# Patient Record
Sex: Female | Born: 1980 | Race: Black or African American | Hispanic: No | Smoking: Never smoker
Health system: Southern US, Community
[De-identification: ages and names within clinical notes are randomized; demographics above are authoritative.]

## PROBLEM LIST (undated history)

## (undated) DIAGNOSIS — J45909 Unspecified asthma, uncomplicated: Secondary | ICD-10-CM

## (undated) DIAGNOSIS — M1990 Unspecified osteoarthritis, unspecified site: Secondary | ICD-10-CM

## (undated) DIAGNOSIS — D219 Benign neoplasm of connective and other soft tissue, unspecified: Secondary | ICD-10-CM

## (undated) DIAGNOSIS — D649 Anemia, unspecified: Secondary | ICD-10-CM

## (undated) DIAGNOSIS — K219 Gastro-esophageal reflux disease without esophagitis: Secondary | ICD-10-CM

## (undated) HISTORY — DX: Gastro-esophageal reflux disease without esophagitis: K21.9

## (undated) HISTORY — DX: Unspecified asthma, uncomplicated: J45.909

## (undated) HISTORY — PX: TUBAL LIGATION: SHX77

## (undated) HISTORY — DX: Anemia, unspecified: D64.9

## (undated) HISTORY — DX: Unspecified osteoarthritis, unspecified site: M19.90

---

## 2001-12-04 ENCOUNTER — Emergency Department (HOSPITAL_COMMUNITY): Admission: EM | Admit: 2001-12-04 | Discharge: 2001-12-04 | Payer: Self-pay | Admitting: Emergency Medicine

## 2004-06-26 ENCOUNTER — Emergency Department: Payer: Self-pay | Admitting: Emergency Medicine

## 2004-06-30 ENCOUNTER — Ambulatory Visit: Payer: Self-pay | Admitting: Emergency Medicine

## 2004-11-10 ENCOUNTER — Emergency Department: Payer: Self-pay | Admitting: Emergency Medicine

## 2004-12-19 ENCOUNTER — Emergency Department: Payer: Self-pay | Admitting: Emergency Medicine

## 2005-04-08 ENCOUNTER — Emergency Department (HOSPITAL_COMMUNITY): Admission: EM | Admit: 2005-04-08 | Discharge: 2005-04-08 | Payer: Self-pay | Admitting: Family Medicine

## 2005-06-10 ENCOUNTER — Emergency Department (HOSPITAL_COMMUNITY): Admission: EM | Admit: 2005-06-10 | Discharge: 2005-06-10 | Payer: Self-pay | Admitting: Family Medicine

## 2005-12-28 ENCOUNTER — Emergency Department (HOSPITAL_COMMUNITY): Admission: EM | Admit: 2005-12-28 | Discharge: 2005-12-28 | Payer: Self-pay | Admitting: Family Medicine

## 2006-04-09 ENCOUNTER — Emergency Department (HOSPITAL_COMMUNITY): Admission: EM | Admit: 2006-04-09 | Discharge: 2006-04-09 | Payer: Self-pay | Admitting: Emergency Medicine

## 2006-06-13 ENCOUNTER — Ambulatory Visit: Payer: Self-pay | Admitting: Unknown Physician Specialty

## 2007-02-16 ENCOUNTER — Emergency Department: Payer: Self-pay | Admitting: Unknown Physician Specialty

## 2008-04-23 ENCOUNTER — Ambulatory Visit: Payer: Self-pay

## 2010-12-13 ENCOUNTER — Emergency Department: Payer: Self-pay | Admitting: Emergency Medicine

## 2012-03-05 ENCOUNTER — Emergency Department: Payer: Self-pay | Admitting: Emergency Medicine

## 2013-05-31 ENCOUNTER — Emergency Department: Payer: Self-pay | Admitting: Emergency Medicine

## 2013-11-17 ENCOUNTER — Emergency Department: Payer: Self-pay | Admitting: Emergency Medicine

## 2014-03-03 ENCOUNTER — Encounter: Payer: Self-pay | Admitting: Gastroenterology

## 2014-03-31 ENCOUNTER — Ambulatory Visit: Payer: Self-pay | Admitting: Gastroenterology

## 2014-07-18 ENCOUNTER — Encounter: Payer: Self-pay | Admitting: Gastroenterology

## 2014-07-18 ENCOUNTER — Ambulatory Visit (INDEPENDENT_AMBULATORY_CARE_PROVIDER_SITE_OTHER): Payer: BLUE CROSS/BLUE SHIELD | Admitting: Gastroenterology

## 2014-07-18 VITALS — BP 138/62 | HR 68 | Ht 66.25 in | Wt 172.0 lb

## 2014-07-18 DIAGNOSIS — K219 Gastro-esophageal reflux disease without esophagitis: Secondary | ICD-10-CM | POA: Diagnosis not present

## 2014-07-18 MED ORDER — PANTOPRAZOLE SODIUM 40 MG PO TBEC: 40.0000 mg | DELAYED_RELEASE_TABLET | Freq: Every day | ORAL | Status: AC

## 2014-07-18 NOTE — Progress Notes (Signed)
HPI: This is a   very pleasant 34 year old woman   whom I am meeting for the first time today  Chief complaint is intermittent dysphagia, GERD symptoms  Has trouble with swallowing, pyrosis.  Her sister found she had h. Pylori.  Feels solid food dysphagia (neck, chest) for 1-2 years.   Overall stable weight.  Will occur 2-3 times per weeks.  Acid taste in mouth.  Started pantoprazole, 2 months ago.  This has really helped.  Usually twice daily.  AM pill, eats 2 hours later.  PM pill is at bedtime.  3 sons [phone])  Has cut back on caffeine  Review of systems: Pertinent positive and negative review of systems were noted in the above HPI section. Complete review of systems was performed and was otherwise normal.   Past Medical History  Diagnosis Date  . Anemia     Past Surgical History  Procedure Laterality Date  . Tubal ligation      Current Outpatient Prescriptions  Medication Sig Dispense Refill  . pantoprazole (PROTONIX) 20 MG tablet Take 20 mg by mouth daily.   1   No current facility-administered medications for this visit.    Allergies as of 07/18/2014 - Review Complete 07/18/2014  Allergen Reaction Noted  . Penicillins Rash 07/18/2014    Family History  Problem Relation Age of Onset  . Ovarian cancer Mother   . Ovarian cancer Maternal Grandmother   . Esophageal cancer Paternal Grandmother   . Colon cancer Neg Hx   . Colon polyps Maternal Aunt     x3  . Colon polyps Maternal Grandmother   . Colon polyps Sister   . Diabetes Neg Hx   . Gallbladder disease Neg Hx   . Heart disease Neg Hx     History   Social History  . Marital Status: Divorced    Spouse Name: N/A  . Number of Children: 3  . Years of Education: N/A   Occupational History  . Supervisor    Social History Main Topics  . Smoking status: Never Smoker   . Smokeless tobacco: Never Used  . Alcohol Use: 0.0 oz/week    0 Standard drinks or equivalent per week     Comment:  Occassionally  . Drug Use: No  . Sexual Activity: Not on file   Other Topics Concern  . Not on file   Social History Narrative  . No narrative on file     Physical Exam: Ht 5' 6.25" (1.683 m)  Wt 172 lb (78.019 kg)  BMI 27.54 kg/m2 Constitutional: generally well-appearing Psychiatric: alert and oriented x3 Eyes: extraocular movements intact Mouth: oral pharynx moist, no lesions Neck: supple no lymphadenopathy Cardiovascular: heart regular rate and rhythm Lungs: clear to auscultation bilaterally Abdomen: soft, nontender, nondistended, no obvious ascites, no peritoneal signs, normal bowel sounds Extremities: no lower extremity edema bilaterally Skin: no lesions on visible extremities   Assessment and plan: 34 y.o. female with  pyrosis, intermittent solid food only dysphasia  I suspect most if not all of her symptoms are related to GERD. I recommended she change her proton pump inhibitor to 20-30 minutes prior to a meal as that is the way the pill is designed to work most effectively. She will also start H2 blocker at bedtime every night. We will proceed with EGD at her soonest convenience to check for stricture, dilated if needed. I also recommended she watch her caffeine and chocolate intake as those can worsen GERD.   Owens Loffler, MD [facility] 07/18/2014, 9:31 AM  Cc: No ref. provider found

## 2014-07-18 NOTE — Patient Instructions (Signed)
New prescription for pantoprozole (40mg ), best time is 20-30 min before a meal. Ranitidine 150mg  pill, take one pill at bedtime every night. You will be set up for an upper endoscopy (dilation if needed). Watch your caffeine and chocolate intake, they can worsen GERD symptoms.

## 2014-07-23 ENCOUNTER — Encounter: Payer: Self-pay | Admitting: Gastroenterology

## 2014-07-28 ENCOUNTER — Encounter: Payer: BLUE CROSS/BLUE SHIELD | Admitting: Gastroenterology

## 2014-09-01 ENCOUNTER — Ambulatory Visit (AMBULATORY_SURGERY_CENTER): Payer: BLUE CROSS/BLUE SHIELD | Admitting: Gastroenterology

## 2014-09-01 ENCOUNTER — Encounter: Payer: Self-pay | Admitting: Gastroenterology

## 2014-09-01 VITALS — BP 119/64 | HR 86 | Temp 99.0°F | Resp 16 | Ht 66.25 in | Wt 172.0 lb

## 2014-09-01 DIAGNOSIS — K219 Gastro-esophageal reflux disease without esophagitis: Secondary | ICD-10-CM

## 2014-09-01 DIAGNOSIS — K2970 Gastritis, unspecified, without bleeding: Secondary | ICD-10-CM

## 2014-09-01 DIAGNOSIS — K2950 Unspecified chronic gastritis without bleeding: Secondary | ICD-10-CM | POA: Diagnosis not present

## 2014-09-01 DIAGNOSIS — K2990 Gastroduodenitis, unspecified, without bleeding: Secondary | ICD-10-CM

## 2014-09-01 MED ORDER — SODIUM CHLORIDE 0.9 % IV SOLN: 500.0000 mL | INTRAVENOUS | Status: DC

## 2014-09-01 NOTE — Progress Notes (Signed)
Called to room to assist during endoscopic procedure.  Patient ID and intended procedure confirmed with present staff. Received instructions for my participation in the procedure from the performing physician.  

## 2014-09-01 NOTE — Progress Notes (Signed)
Report to PACU, RN, vss, BBS= Clear.  

## 2014-09-01 NOTE — Patient Instructions (Signed)
Gastritis seen today, handouts given on gastritis and GERD.  Biopsies taken.  Resume current medications. Consider OTC Ranitidine 150 mg pill at bedtime nightly as needed for GERD.   YOU HAD AN ENDOSCOPIC PROCEDURE TODAY AT [facility]:   Refer to the procedure report that was given to you for any specific questions about what was found during the examination.  If the procedure report does not answer your questions, please call your gastroenterologist to clarify.  If you requested that your care partner not be given the details of your procedure findings, then the procedure report has been included in a sealed envelope for you to review at your convenience later.  YOU SHOULD EXPECT: Some feelings of bloating in the abdomen. Passage of more gas than usual.  Walking can help get rid of the air that was put into your GI tract during the procedure and reduce the bloating. If you had a lower endoscopy (such as a colonoscopy or flexible sigmoidoscopy) you may notice spotting of blood in your stool or on the toilet paper. If you underwent a bowel prep for your procedure, you may not have a normal bowel movement for a few days.  Please Note:  You might notice some irritation and congestion in your nose or some drainage.  This is from the oxygen used during your procedure.  There is no need for concern and it should clear up in a day or so.  SYMPTOMS TO REPORT IMMEDIATELY:    Following upper endoscopy (EGD)  Vomiting of blood or coffee ground material  New chest pain or pain under the shoulder blades  Painful or persistently difficult swallowing  New shortness of breath  Fever of 100F or higher  Black, tarry-looking stools  For urgent or emergent issues, a gastroenterologist can be reached at any hour by calling [phone].   DIET: Your first meal following the procedure should be a small meal and then it is ok to progress to your normal diet. Heavy or fried foods are harder to  digest and may make you feel nauseous or bloated.  Likewise, meals heavy in dairy and vegetables can increase bloating.  Drink plenty of fluids but you should avoid alcoholic beverages for 24 hours.  ACTIVITY:  You should plan to take it easy for the rest of today and you should NOT DRIVE or use heavy machinery until tomorrow (because of the sedation medicines used during the test).    FOLLOW UP: Our staff will call the number listed on your records the next business day following your procedure to check on you and address any questions or concerns that you may have regarding the information given to you following your procedure. If we do not reach you, we will leave a message.  However, if you are feeling well and you are not experiencing any problems, there is no need to return our call.  We will assume that you have returned to your regular daily activities without incident.  If any biopsies were taken you will be contacted by phone or by letter within the next 1-3 weeks.  Please call us at [phone] if you have not heard about the biopsies in 3 weeks.    SIGNATURES/CONFIDENTIALITY: You and/or your care partner have signed paperwork which will be entered into your electronic medical record.  These signatures attest to the fact that that the information above on your After Visit Summary has been reviewed and is understood.  Full responsibility of the  confidentiality of this discharge information lies with you and/or your care-partner. 

## 2014-09-01 NOTE — Op Note (Signed)
[place]  [address]. [place], [id number]   ENDOSCOPY PROCEDURE REPORT  PATIENT: Jaclyn Wagner, Jaclyn Wagner  MR#: [id number] BIRTHDATE: [birth year] , 33  yrs. old GENDER: female ENDOSCOPIST: Milus Banister, MD PROCEDURE DATE:  09/01/2014 PROCEDURE:  EGD w/ biopsy ASA CLASS:     Class II INDICATIONS:  GERD, intermittent dysphagia. MEDICATIONS: Monitored anesthesia care and Propofol 200 mg IV TOPICAL ANESTHETIC: none  DESCRIPTION OF PROCEDURE: After the risks benefits and alternatives of the procedure were thoroughly explained, informed consent was obtained.  The LB [id number] endoscope was introduced through the mouth and advanced to the second portion of the duodenum , Without limitations.  The instrument was slowly withdrawn as the mucosa was fully examined.  There was mild, non-specific distal gastritis.  This was biopsied and sent to pathology.  The examination was othewise normal. Retroflexed views revealed no abnormalities.     The scope was then withdrawn from the patient and the procedure completed. COMPLICATIONS: There were no immediate complications.  ENDOSCOPIC IMPRESSION: There was mild, non-specific distal gastritis.  This was biopsied and sent to pathology.  The examination was othewise normal  RECOMMENDATIONS: Continue protonix once daily (20-30 min before breakfast meal). Consider OTC ranitidine (150mg  pill) at bedtime nightly as needed for GERD.   eSigned:  Milus Banister, MD 09/01/2014 2:11 PM

## 2014-09-02 ENCOUNTER — Telehealth: Payer: Self-pay | Admitting: Emergency Medicine

## 2014-09-02 NOTE — Telephone Encounter (Signed)
  Follow up Call-  Call back number 09/01/2014  Post procedure Call Back phone  # [phone]  Permission to leave phone message Yes     Patient questions:  Do you have a fever, pain , or abdominal swelling? No. Pain Score  0 *  Have you tolerated food without any problems? Yes.    Have you been able to return to your normal activities? Yes.    Do you have any questions about your discharge instructions: Diet   No. Medications  No. Follow up visit  No.  Do you have questions or concerns about your Care? No.  Actions: * If pain score is 4 or above: No action needed, pain <4.

## 2014-09-04 ENCOUNTER — Encounter: Payer: Self-pay | Admitting: Gastroenterology

## 2015-04-20 HISTORY — PX: UTERINE ARTERY EMBOLIZATION: SHX2629

## 2015-04-25 ENCOUNTER — Emergency Department: Payer: BLUE CROSS/BLUE SHIELD

## 2015-04-25 ENCOUNTER — Encounter: Payer: Self-pay | Admitting: Urgent Care

## 2015-04-25 ENCOUNTER — Emergency Department
Admission: EM | Admit: 2015-04-25 | Discharge: 2015-04-25 | Disposition: A | Discharge status: Home / Self Care | Payer: BLUE CROSS/BLUE SHIELD | Attending: Emergency Medicine | Admitting: Emergency Medicine

## 2015-04-25 DIAGNOSIS — R1030 Lower abdominal pain, unspecified: Secondary | ICD-10-CM

## 2015-04-25 DIAGNOSIS — N838 Other noninflammatory disorders of ovary, fallopian tube and broad ligament: Secondary | ICD-10-CM | POA: Diagnosis not present

## 2015-04-25 DIAGNOSIS — R5082 Postprocedural fever: Secondary | ICD-10-CM | POA: Diagnosis not present

## 2015-04-25 DIAGNOSIS — D649 Anemia, unspecified: Secondary | ICD-10-CM | POA: Diagnosis not present

## 2015-04-25 DIAGNOSIS — R509 Fever, unspecified: Secondary | ICD-10-CM

## 2015-04-25 DIAGNOSIS — Z880 Allergy status to penicillin: Secondary | ICD-10-CM | POA: Diagnosis not present

## 2015-04-25 DIAGNOSIS — Z3202 Encounter for pregnancy test, result negative: Secondary | ICD-10-CM | POA: Insufficient documentation

## 2015-04-25 DIAGNOSIS — Z79899 Other long term (current) drug therapy: Secondary | ICD-10-CM | POA: Insufficient documentation

## 2015-04-25 DIAGNOSIS — G8918 Other acute postprocedural pain: Secondary | ICD-10-CM | POA: Insufficient documentation

## 2015-04-25 DIAGNOSIS — Z9889 Other specified postprocedural states: Secondary | ICD-10-CM

## 2015-04-25 DIAGNOSIS — R1032 Left lower quadrant pain: Secondary | ICD-10-CM | POA: Diagnosis present

## 2015-04-25 HISTORY — DX: Benign neoplasm of connective and other soft tissue, unspecified: D21.9

## 2015-04-25 LAB — URINALYSIS COMPLETE WITH MICROSCOPIC (ARMC ONLY)
BILIRUBIN URINE: NEGATIVE
Bacteria, UA: NONE SEEN
GLUCOSE, UA: NEGATIVE mg/dL
KETONES UR: NEGATIVE mg/dL
Nitrite: NEGATIVE
Protein, ur: NEGATIVE mg/dL
Specific Gravity, Urine: 1.013 (ref 1.005–1.030)
pH: 7 (ref 5.0–8.0)

## 2015-04-25 LAB — CBC WITH DIFFERENTIAL/PLATELET
Basophils Absolute: 0 10*3/uL (ref 0–0.1)
Basophils Relative: 1 %
EOS ABS: 0 10*3/uL (ref 0–0.7)
EOS PCT: 1 %
HCT: 26.6 % — ABNORMAL LOW (ref 35.0–47.0)
Hemoglobin: 8.8 g/dL — ABNORMAL LOW (ref 12.0–16.0)
LYMPHS ABS: 1.9 10*3/uL (ref 1.0–3.6)
Lymphocytes Relative: 23 %
MCH: 26.7 pg (ref 26.0–34.0)
MCHC: 32.9 g/dL (ref 32.0–36.0)
MCV: 81.2 fL (ref 80.0–100.0)
Monocytes Absolute: 1 10*3/uL — ABNORMAL HIGH (ref 0.2–0.9)
Monocytes Relative: 12 %
Neutro Abs: 5.3 10*3/uL (ref 1.4–6.5)
Neutrophils Relative %: 63 %
PLATELETS: 346 10*3/uL (ref 150–440)
RBC: 3.28 MIL/uL — AB (ref 3.80–5.20)
RDW: 14.2 % (ref 11.5–14.5)
WBC: 8.2 10*3/uL (ref 3.6–11.0)

## 2015-04-25 LAB — BASIC METABOLIC PANEL
Anion gap: 7 (ref 5–15)
BUN: 7 mg/dL (ref 6–20)
CO2: 26 mmol/L (ref 22–32)
CREATININE: 0.81 mg/dL (ref 0.44–1.00)
Calcium: 8.4 mg/dL — ABNORMAL LOW (ref 8.9–10.3)
Chloride: 104 mmol/L (ref 101–111)
GFR calc Af Amer: >60 mL/min (ref >60)
Glucose, Bld: 103 mg/dL — ABNORMAL HIGH (ref 65–99)
POTASSIUM: 3.9 mmol/L (ref 3.5–5.1)
SODIUM: 137 mmol/L (ref 135–145)

## 2015-04-25 LAB — PREGNANCY, URINE: Preg Test, Ur: NEGATIVE

## 2015-04-25 LAB — LACTIC ACID, PLASMA: Lactic Acid, Venous: 0.9 mmol/L (ref 0.5–2.0)

## 2015-04-25 MED ORDER — LEVOFLOXACIN IN D5W 750 MG/150ML IV SOLN
750.0000 mg | Freq: Once | INTRAVENOUS | Status: AC
  Administered 2015-04-25: 750 mg via INTRAVENOUS
  Filled 2015-04-25: qty 150

## 2015-04-25 MED ORDER — METRONIDAZOLE IN NACL 5-0.79 MG/ML-% IV SOLN
500.0000 mg | Freq: Once | INTRAVENOUS | Status: AC
  Administered 2015-04-25: 500 mg via INTRAVENOUS
  Filled 2015-04-25: qty 100

## 2015-04-25 MED ORDER — ONDANSETRON HCL 4 MG/2ML IJ SOLN
4.0000 mg | Freq: Once | INTRAMUSCULAR | Status: AC
  Administered 2015-04-25: 4 mg via INTRAVENOUS
  Filled 2015-04-25: qty 2

## 2015-04-25 MED ORDER — IBUPROFEN 800 MG PO TABS: 800.0000 mg | ORAL_TABLET | Freq: Three times a day (TID) | ORAL | Status: AC | PRN

## 2015-04-25 MED ORDER — SODIUM CHLORIDE 0.9 % IV BOLUS (SEPSIS)
500.0000 mL | INTRAVENOUS | Status: AC
  Administered 2015-04-25: 500 mL via INTRAVENOUS

## 2015-04-25 MED ORDER — MORPHINE SULFATE (PF) 4 MG/ML IV SOLN
4.0000 mg | Freq: Once | INTRAVENOUS | Status: AC
  Administered 2015-04-25: 4 mg via INTRAVENOUS
  Filled 2015-04-25: qty 1

## 2015-04-25 MED ORDER — HYDROCODONE-ACETAMINOPHEN 5-325 MG PO TABS: 1.0000 | ORAL_TABLET | Freq: Four times a day (QID) | ORAL | Status: AC | PRN

## 2015-04-25 MED ORDER — SODIUM CHLORIDE 0.9 % IV BOLUS (SEPSIS)
1000.0000 mL | INTRAVENOUS | Status: AC
  Administered 2015-04-25 (×2): 1000 mL via INTRAVENOUS

## 2015-04-25 MED ORDER — ACETAMINOPHEN 500 MG PO TABS
1000.0000 mg | ORAL_TABLET | Freq: Once | ORAL | Status: AC
  Administered 2015-04-25: 1000 mg via ORAL
  Filled 2015-04-25: qty 2

## 2015-04-25 MED ORDER — IOHEXOL 240 MG/ML SOLN
25.0000 mL | Freq: Once | INTRAMUSCULAR | Status: AC | PRN
  Administered 2015-04-25: 25 mL via ORAL

## 2015-04-25 MED ORDER — PIPERACILLIN-TAZOBACTAM 3.375 G IVPB 30 MIN: 3.3750 g | Freq: Once | INTRAVENOUS | Status: DC

## 2015-04-25 MED ORDER — VANCOMYCIN HCL IN DEXTROSE 1-5 GM/200ML-% IV SOLN
1000.0000 mg | Freq: Once | INTRAVENOUS | Status: AC
  Administered 2015-04-25: 1000 mg via INTRAVENOUS
  Filled 2015-04-25: qty 200

## 2015-04-25 MED ORDER — IOHEXOL 300 MG/ML  SOLN
100.0000 mL | Freq: Once | INTRAMUSCULAR | Status: AC | PRN
  Administered 2015-04-25: 100 mL via INTRAVENOUS

## 2015-04-25 MED ORDER — ONDANSETRON 4 MG PO TBDP: 4.0000 mg | ORAL_TABLET | Freq: Three times a day (TID) | ORAL | Status: AC | PRN

## 2015-04-25 MED ORDER — SODIUM CHLORIDE 0.9 % IV BOLUS (SEPSIS)
1000.0000 mL | Freq: Once | INTRAVENOUS | Status: AC
  Administered 2015-04-25: 1000 mL via INTRAVENOUS

## 2015-04-25 NOTE — Consult Note (Signed)
Obstetrics & Gynecology Consultation Note  Date of Consultation: 04/25/2015   Requesting Provider: [facility] ER;  Dr. Beather Arbour  Primary OBGYN: [facility]: Lambert Rhonda W, Dr. Elgie Collard   Reason for Consultation: pain, fevers s/p [place]  History of Present Illness: Jaclyn Wagner is a 35 y.o. G3P3 (Patient's last menstrual period was 04/03/2015.), with the above CC. PMHx is significant for h/o BTL, uterine fibroids, BMI 27.  She had a  [place] on 04/20/15 @ [facility] for uterine fibroids (pain and bleeding).  She states that she was kept overnight and then discharged home. Ever since the procedure, she's had left lower back sharp discomfort that is persisting (not getting any better or worse) and she's had occasional fevers but felt that it was worse last night so came in for evaluation. Fevers in the ER but came down with tylenol. She recently received vanc and flagyl in the ER.   No chest pain, SOB, nausea, vomiting, dysuria, hematuria, abdominal pain. +slight  ROS: A 12-point review of systems was performed and negative, except as stated in the above HPI.  OBGYN History: As per HPI. OB History    Gravida Para Term Preterm AB TAB SAB Ectopic Multiple Living   3 3 3       3       Obstetric Comments   G3P3. SVD x 3.      History of abnormal pap smears: Yes. Last pap smear normal pap 2016, per patient. No hx of cervical surgeries History of STIs: No   Past Medical History: Past Medical History  Diagnosis Date  . Anemia   . Arthritis     R HAND  . Asthma     AS A CHILD  . GERD (gastroesophageal reflux disease)   . Fibroids     Past Surgical History: Past Surgical History  Procedure Laterality Date  . Tubal ligation    . Uterine artery embolization  04/20/15    at [facility]    Family History:  Family History  Problem Relation Age of Onset  . Ovarian cancer Mother   . Ovarian cancer Maternal Grandmother   . Esophageal cancer Paternal Grandmother   . Colon  cancer Neg Hx   . Colon polyps Maternal Aunt     x3  . Colon polyps Maternal Grandmother   . Colon polyps Sister   . Diabetes Neg Hx   . Gallbladder disease Neg Hx   . Heart disease Neg Hx    She denies any bleeding or blood clotting disorders.   Social History:  Social History   Social History  . Marital Status: Divorced    Spouse Name: N/A  . Number of Children: 3  . Years of Education: N/A   Occupational History  . Supervisor    Social History Main Topics  . Smoking status: Never Smoker   . Smokeless tobacco: Never Used  . Alcohol Use: 0.0 oz/week    0 Standard drinks or equivalent per week     Comment: Occassionally  . Drug Use: No  . Sexual Activity: Not on file   Other Topics Concern  . Not on file   Social History Narrative    Allergy: Allergies  Allergen Reactions  . Penicillins Rash    Current Outpatient Medications: patient unsure of exact meds but was using PRN motrin and oxycodone  Physical Exam: Patient Vitals for the past 24 hrs:  BP Temp Temp src Pulse Resp SpO2 Height Weight  04/25/15 0700 (!) 131/91 mmHg - -  67 13 100 % - -  04/25/15 0630 (!) 150/94 mmHg - - 63 12 100 % - -  04/25/15 [id number] (!) 142/94 mmHg 98.1 F (36.7 C) Oral 62 14 100 % - -  04/25/15 0442 130/80 mmHg 99.7 F (37.6 C) Oral 84 16 100 % - -  04/25/15 0330 134/82 mmHg - - 85 18 100 % - -  04/25/15 0245 (!) 139/98 mmHg - - 83 - 100 % - -  04/25/15 0232 129/88 mmHg (!) 101.1 F (38.4 C) Oral 82 16 100 % - -  04/25/15 0058 (!) 143/74 mmHg (!) 100.5 F (38.1 C) Oral 99 18 100 % 5\' 7"  (1.702 m) 175 lb (79.379 kg)    Body mass index is 27.4 kg/(m^2). General appearance: Well nourished, well developed female in no acute distress.  Cardiovascular: normal s1 and s2.  No murmurs, rubs or gallops. Respiratory:  Clear to auscultation bilateral. Normal respiratory effort Abdomen: obese, soft, nttp, nd Back: no CVAT Ext: well healed left inguinal crease [place] site Neuro/Psych:   Normal mood and affect.  Skin:  Warm and dry.  Lymphatic:  No inguinal lymphadenopathy.   Laboratory: Negative: UPT and u/a  Recent Labs Lab 04/25/15 0219  WBC 8.2  HGB 8.8*  HCT 26.6*  PLT 346  NEUTOPHILPCT 63  MONOPCT 12     Recent Labs Lab 04/25/15 0219  NA 137  K 3.9  CL 104  CO2 26  BUN 7  CREATININE 0.81  CALCIUM 8.4*  GLUCOSE 103*   Pending: BCx x 2  Imaging:  CLINICAL DATA: Acute onset of left lower quadrant abdominal pain, lower back pain and fever. Recent uterine fibroid embolization. Initial encounter.  EXAM: CT ABDOMEN AND PELVIS WITH CONTRAST  TECHNIQUE: Multidetector CT imaging of the abdomen and pelvis was performed using the standard protocol following bolus administration of intravenous contrast.  CONTRAST: 119mL OMNIPAQUE IOHEXOL 300 MG/ML SOLN  COMPARISON: CT of the abdomen and pelvis from 02/16/2007  FINDINGS: The visualized lung bases are clear.  The liver and spleen are unremarkable in appearance. The gallbladder is within normal limits. The pancreas and adrenal glands are unremarkable.  Nonobstructing bilateral renal stones measure up to 5 mm in size. The kidneys are otherwise unremarkable. There is no evidence of hydronephrosis. No obstructing ureteral stones are identified. No perinephric stranding is appreciated.  No free fluid is identified. The small bowel is unremarkable in appearance. The stomach is within normal limits. No acute vascular abnormalities are seen.  The appendix is not definitely characterized; there is no evidence for appendicitis. The colon is largely filled with stool, raising concern for mild constipation.  The bladder is moderately distended and grossly unremarkable.  There appears to be diffuse centrally decreased enhancement of much of the uterine myometrium, extending about the endometrial canal, concerning for acute uterine ischemia. This corresponds to risk for uterine  necrosis. An apparent smaller nonenhancing fibroid is noted anteriorly. The ovaries are relatively symmetric and grossly unremarkable. No inguinal lymphadenopathy is seen.  No acute osseous abnormalities are identified.  IMPRESSION: 1. Diffuse centrally decreased enhancement of much of the uterine myometrium, extending about the endometrial canal, concerning for acute uterine ischemia. This corresponds to risk for uterine necrosis. Pelvic MRI with and without contrast is recommended for further evaluation. 2. Smaller apparent nonenhancing fibroid noted anteriorly. 3. Colon largely filled with stool, concerning for mild constipation. 4. Nonobstructing bilateral renal stones measure up to 5 mm in size.  These results were called by telephone  at the time of interpretation on 04/25/2015 at 5:39 am to Dr. Lurline Hare, who verbally acknowledged these results.   Electronically Signed  By: Garald Balding M.D.  On: 04/25/2015 05:44  Assessment: Jaclyn Wagner is a 35 y.o. G3P3 (Patient's last menstrual period was 04/03/2015.) who presented to the ED with LL back pain and fevers at home. Pt currently stable.   Plan: D/w the ER that likely normal but they were unable to get in touch with IR at [facility], so I came down for evaluation. Pt with likely normal constellation of s/s, especially with negative physical exam and lab work and imaging likely showing normal post surgical changes. I was able to get in touch with Dr. Marti Sleigh (on call for IR at [facility]), and he agrees that patient is fine for discharge.  Total time taking care of the patient was 30 minutes, with greater than 50% of the time spent in face to face interaction with the patient.  Durene Romans MD [facility] Pager [phone]

## 2015-04-25 NOTE — ED Provider Notes (Signed)
[facility] Emergency Department Provider Note ____________________________________________  Time seen: Approximately 1:49 AM  I have reviewed the triage vital signs and the nursing notes.   HISTORY  Chief Complaint Post-op Problem    HPI Jaclyn Wagner is a 35 y.o. female who presents to the ED from home with a chief complaint of abdominal and back pain. Patient had an uterine fibroid embolization procedure 5 days ago in [place]. She presents with fever (maximally 100.39F), left lower quadrant abdominal pain and left lower back pain since her procedure. Notes her urine has been cloudy as well. Denies associated chest pain, shortness of breath, nausea, vomiting, diarrhea.Denies recent travel or trauma. Nothing makes her symptoms better or worse.   Past Medical History  Diagnosis Date  . Anemia   . Arthritis     R HAND  . Asthma     AS A CHILD  . GERD (gastroesophageal reflux disease)   . Fibroids     There are no active problems to display for this patient.   Past Surgical History  Procedure Laterality Date  . Tubal ligation    . Tubal ligation      Current Outpatient Rx  Name  Route  Sig  Dispense  Refill  . pantoprazole (PROTONIX) 40 MG tablet   Oral   Take 1 tablet (40 mg total) by mouth daily.   30 tablet   6     Allergies Penicillins  Family History  Problem Relation Age of Onset  . Ovarian cancer Mother   . Ovarian cancer Maternal Grandmother   . Esophageal cancer Paternal Grandmother   . Colon cancer Neg Hx   . Colon polyps Maternal Aunt     x3  . Colon polyps Maternal Grandmother   . Colon polyps Sister   . Diabetes Neg Hx   . Gallbladder disease Neg Hx   . Heart disease Neg Hx     Social History Social History  Substance Use Topics  . Smoking status: Never Smoker   . Smokeless tobacco: Never Used  . Alcohol Use: 0.0 oz/week    0 Standard drinks or equivalent per week     Comment: Occassionally    Review of  Systems  Constitutional: Positive for fever. Eyes: No visual changes. ENT: No sore throat. Cardiovascular: Denies chest pain. Respiratory: Denies shortness of breath. Gastrointestinal: Positive for abdominal pain.  No nausea, no vomiting.  No diarrhea.  No constipation. Genitourinary: Positive for cloudy urine. Negative for dysuria. Musculoskeletal: Positive for back pain. Skin: Negative for rash. Neurological: Negative for headaches, focal weakness or numbness.  10-point ROS otherwise negative.  ____________________________________________   PHYSICAL EXAM:  VITAL SIGNS: ED Triage Vitals  Enc Vitals Group     BP 04/25/15 0058 143/74 mmHg     Pulse Rate 04/25/15 0058 99     Resp 04/25/15 0058 18     Temp 04/25/15 0058 100.5 F (38.1 C)     Temp Source 04/25/15 0058 Oral     SpO2 04/25/15 0058 100 %     Weight 04/25/15 0058 175 lb (79.379 kg)     Height 04/25/15 0058 5\' 7"  (1.702 m)     Head Cir --      Peak Flow --      Pain Score 04/25/15 0115 7     Pain Loc --      Pain Edu? --      Excl. in [place]? --     Constitutional: Alert and oriented. Well  appearing and in no acute distress. Eyes: Conjunctivae are normal. PERRL. EOMI. Head: Atraumatic. Nose: No congestion/rhinnorhea. Mouth/Throat: Mucous membranes are moist.  Oropharynx non-erythematous. Neck: No stridor.   Cardiovascular: Normal rate, regular rhythm. Grossly normal heart sounds.  Good peripheral circulation. Respiratory: Normal respiratory effort.  No retractions. Lungs CTAB. Gastrointestinal: Soft and mildly tender to palpation suprapubic and left lower quadrant without rebound or guarding. No distention. No abdominal bruits. No CVA tenderness. Musculoskeletal: No lower extremity tenderness nor edema.  No joint effusions. Neurologic:  Normal speech and language. No gross focal neurologic deficits are appreciated. No gait instability. Skin:  Skin is warm, dry and intact. No rash noted. Psychiatric: Mood and  affect are normal. Speech and behavior are normal.  ____________________________________________   LABS (all labs ordered are listed, but only abnormal results are displayed)  Labs Reviewed  CBC WITH DIFFERENTIAL/PLATELET - Abnormal; Notable for the following:    RBC 3.28 (*)    Hemoglobin 8.8 (*)    HCT 26.6 (*)    Monocytes Absolute 1.0 (*)    All other components within normal limits  BASIC METABOLIC PANEL - Abnormal; Notable for the following:    Glucose, Bld 103 (*)    Calcium 8.4 (*)    All other components within normal limits  URINALYSIS COMPLETEWITH MICROSCOPIC ([facility] ONLY) - Abnormal; Notable for the following:    Color, Urine YELLOW (*)    APPearance CLEAR (*)    Hgb urine dipstick 2+ (*)    Leukocytes, UA TRACE (*)    Squamous Epithelial / LPF 0-5 (*)    All other components within normal limits  CULTURE, BLOOD (ROUTINE X 2)  CULTURE, BLOOD (ROUTINE X 2)  PREGNANCY, URINE  LACTIC ACID, PLASMA  LACTIC ACID, PLASMA   ____________________________________________  EKG  None ____________________________________________  RADIOLOGY  Chest 2 view (view by me, interpreted per Dr. Quintella Reichert): No active cardiopulmonary disease.  CT abdomen/pelvis discussed with Dr. Radene Knee: 1. Diffuse centrally decreased enhancement of much of the uterine myometrium, extending about the endometrial canal, concerning for acute uterine ischemia. This corresponds to risk for uterine necrosis. Pelvic MRI with and without contrast is recommended for further evaluation. 2. Smaller apparent nonenhancing fibroid noted anteriorly. 3. Colon largely filled with stool, concerning for mild constipation. 4. Nonobstructing bilateral renal stones measure up to 5 mm in size. ____________________________________________   PROCEDURES  Procedure(s) performed: None  Critical Care performed:   CRITICAL CARE Performed by: Paulette Blanch   Total critical care time: 30 minutes  Critical care  time was exclusive of separately billable procedures and treating other patients.  Critical care was necessary to treat or prevent imminent or life-threatening deterioration.  Critical care was time spent personally by me on the following activities: development of treatment plan with patient and/or surrogate as well as nursing, discussions with consultants, evaluation of patient's response to treatment, examination of patient, obtaining history from patient or surrogate, ordering and performing treatments and interventions, ordering and review of laboratory studies, ordering and review of radiographic studies, pulse oximetry and re-evaluation of patient's condition.  ____________________________________________   INITIAL IMPRESSION / ASSESSMENT AND PLAN / ED COURSE  Pertinent labs & imaging results that were available during my care of the patient were reviewed by me and considered in my medical decision making (see chart for details).  35 year old female who presents 5 days s/p uterine fibroid embolization procedure with low-grade fever, cloudy urine, abdominal and back pain. Will obtain screening lab work including blood cultures, urinalysis, chest  x-ray. Initiate IV fluid resuscitation and reassess.  ----------------------------------------- 3:43 AM on 04/25/2015 -----------------------------------------  Temperature now 101.38F. Updated patient of laboratory, urinalysis and imaging results. Overall these are unremarkable and does not explain the etiology of patient's left lower quadrant and left flank pain. Will proceed to CT abdomen/pelvis.  ----------------------------------------- 5:49 AM on 04/25/2015 -----------------------------------------  Discuss with radiology CT results. Spoke with Dr. Ilda Basset from the gynecology service; requests that I try to contact the performing interventional department to discuss patient's case and CT scan for management recommendations. Patient gives  me the number for [facility] radiology department [phone]. I called the number given and was told there is no interventional radiologist available.  ----------------------------------------- 6:26 AM on 04/25/2015 -----------------------------------------  Spoke again with Dr. Ilda Basset who will evaluate patient in the emergency department.  ----------------------------------------- 7:24 AM on 04/25/2015 -----------------------------------------  Dr. Ilda Basset evaluated patient in the emergency department and he also managed to speak with interventional radiologist in [place] who feels CT reflects expected postoperative changes from uterine embolization. Recommends discharge home to follow-up with interventional radiologist on Monday. Does not recommend discharging patient on antibiotic. I have discussed the above plan extensively with the patient. She tells me she has both a follow-up appointment with [facility] clinic as well as radiology. Also has oxycodone and antiemetic. Will discharge after IV antibiotics are completed. Very strict return precautions given. Patient verbalizes understanding and agrees with plan of care. ____________________________________________   FINAL CLINICAL IMPRESSION(S) / ED DIAGNOSES  Final diagnoses:  Lower abdominal pain  Fever, unspecified fever cause  Uterine ischemia    Paulette Blanch, MD 04/25/15 [phone]

## 2015-04-25 NOTE — ED Notes (Signed)
Patient presents with c/o LLQ abd pain and lower back pain since she had a uterine fibroid embolization procedure on Monday. (+) fever with a Tmax of 100.5.

## 2015-04-25 NOTE — Discharge Instructions (Signed)
1. You may take pain and nausea medicines as needed (Motrin/Norco/Zofran #15). 2. You were evaluated by the specialist who felt your CT findings were to be expected after your procedure. He did not recommend that you continue antibiotics and requests that you follow-up with the doctor who performed your procedure. 3. Alternate Tylenol and Motrin every 4 hours as needed for fever greater than 100.12F. 4. Return to the ER for worsening symptoms, persistent vomiting, difficulty breathing or other concerns.  Abdominal Pain, Adult Many things can cause belly (abdominal) pain. Most times, the belly pain is not dangerous. Many cases of belly pain can be watched and treated at home. HOME CARE   Do not take medicines that help you go poop (laxatives) unless told to by your doctor.  Only take medicine as told by your doctor.  Eat or drink as told by your doctor. Your doctor will tell you if you should be on a special diet. GET HELP IF:  You do not know what is causing your belly pain.  You have belly pain while you are sick to your stomach (nauseous) or have runny poop (diarrhea).  You have pain while you pee or poop.  Your belly pain wakes you up at night.  You have belly pain that gets worse or better when you eat.  You have belly pain that gets worse when you eat fatty foods.  You have a fever. GET HELP RIGHT AWAY IF:   The pain does not go away within 2 hours.  You keep throwing up (vomiting).  The pain changes and is only in the right or left part of the belly.  You have bloody or tarry looking poop. MAKE SURE YOU:   Understand these instructions.  Will watch your condition.  Will get help right away if you are not doing well or get worse.   This information is not intended to replace advice given to you by your health care provider. Make sure you discuss any questions you have with your health care provider.   Document Released: 07/27/2007 Document Revised: 02/28/2014  Document Reviewed: 10/17/2012 Elsevier Interactive Patient Education 2016 [employer].  Blood Culture Test WHY AM I HAVING THIS TEST? A blood culture test is performed to see if you have an infection in your blood (septicemia). Septicemia could be caused by bacteria, fungi, or viruses. Normally, blood is free of bacteria, fungi, and viruses. This test may be ordered if you have symptoms of septicemia. These symptoms may include fever, chills, nausea, and fatigue. WHAT KIND OF SAMPLE IS TAKEN? At least two blood samples from two different veins are required for this test. The blood samples are usually collected by inserting a needle into a vein. This is done because:  There is a better chance of finding the infection with multiple samples.  Sometimes, despite disinfection of the skin where the blood is collected, you can grow a skin contaminant. This will result in a positive blood culture. This is called a false-positive. With multiple samples, there is a better chance of ruling out a false-positive. HOW DO I PREPARE FOR THE TEST? It is preferred to have the blood samples performed before starting antibiotic medicine. Tell your health care provider if you are currently taking an antibiotic. If blood cultures are performed while you are on an antibiotic, the blood samples should be performed shortly before you take a dose of antibiotic. HOW ARE YOUR TEST RESULTS REPORTED? Your test results will be reported as either positive or  negative. It is your responsibility to obtain your test results. Ask the lab or department performing the test when and how you will get your results. A false-positive result can occur. A false-positive result is incorrect because it indicates a condition or finding is present when it is not. A false-negative result can occur. A false-negative result is incorrect because it indicates a condition or finding is not present when it is. WHAT DO THE RESULTS MEAN? A positive  blood test may mean that you have septicemia. Talk with your health care provider to discuss your results, treatment options, and if necessary, the need for more tests. Talk with your health care provider if you have any questions about your results.   This information is not intended to replace advice given to you by your health care provider. Make sure you discuss any questions you have with your health care provider.   Document Released: 03/02/2004 Document Revised: 02/28/2014 Document Reviewed: 07/15/2013 Elsevier Interactive Patient Education 2016 [place].  Fever, Adult A fever is an increase in the body's temperature. It is usually defined as a temperature of 100F (38C) or higher. Brief mild or moderate fevers generally have no long-term effects, and they often do not require treatment. Moderate or high fevers may make you feel uncomfortable and can sometimes be a sign of a serious illness or disease. The sweating that may occur with repeated or prolonged fever may also cause dehydration. Fever is confirmed by taking a temperature with a thermometer. A measured temperature can vary with:  Age.  Time of day.  Location of the thermometer:  Mouth (oral).  Rectum (rectal).  Ear (tympanic).  Underarm (axillary).  Forehead (temporal). HOME CARE INSTRUCTIONS Pay attention to any changes in your symptoms. Take these actions to help with your condition:  Take over-the counter and prescription medicines only as told by your health care provider. Follow the dosing instructions carefully.  If you were prescribed an antibiotic medicine, take it as told by your health care provider. Do not stop taking the antibiotic even if you start to feel better.  Rest as needed.  Drink enough fluid to keep your urine clear or pale yellow. This helps to prevent dehydration.  Sponge yourself or bathe with room-temperature water to help reduce your body temperature as needed. Do not use ice  water.  Do not overbundle yourself in blankets or heavy clothes. SEEK MEDICAL CARE IF:  You vomit.  You cannot eat or drink without vomiting.  You have diarrhea.  You have pain when you urinate.  Your symptoms do not improve with treatment.  You develop new symptoms.  You develop excessive weakness. SEEK IMMEDIATE MEDICAL CARE IF:  You have shortness of breath or have trouble breathing.  You are dizzy or you faint.  You are disoriented or confused.  You develop signs of dehydration, such as a dry mouth, decreased urination, or paleness.  You develop severe pain in your abdomen.  You have persistent vomiting or diarrhea.  You develop a skin rash.  Your symptoms suddenly get worse.   This information is not intended to replace advice given to you by your health care provider. Make sure you discuss any questions you have with your health care provider.   Document Released: 08/03/2000 Document Revised: 10/29/2014 Document Reviewed: 04/03/2014 Elsevier Interactive Patient Education [employer].

## 2015-04-25 NOTE — Progress Notes (Signed)
Pharmacy Antibiotic Note  Jaclyn Schullo is a 35 y.o. female admitted on 04/25/2015 with IAI.  Pharmacy has been consulted for Zosyn dosing.  Plan: Zosyn 3.375 gm IV Q8H EI  Height: 5\' 7"  (170.2 cm) Weight: 175 lb (79.379 kg) IBW/kg (Calculated) : 61.6  Temp (24hrs), Avg:100.4 F (38 C), Min:99.7 F (37.6 C), Max:101.1 F (38.4 C)   Recent Labs Lab 04/25/15 0219  WBC 8.2  CREATININE 0.81    Estimated Creatinine Clearance: 104.1 mL/min (by C-G formula based on Cr of 0.81).    Allergies  Allergen Reactions  . Penicillins Rash    Thank you for allowing pharmacy to be a part of this patient's care.  Laural Benes, Pharm.D., BCPS Clinical Pharmacist 04/25/2015 5:49 AM

## 2015-04-25 NOTE — ED Notes (Signed)
Pt alert and oriented X4, active, cooperative, pt in NAD. RR even and unlabored, color WNL.  Pt informed to return if any life threatening symptoms occur.  Pt left with friend who is driving.

## 2015-04-27 LAB — URINE CULTURE: Special Requests: NORMAL

## 2015-04-30 LAB — CULTURE, BLOOD (ROUTINE X 2)
Culture: NO GROWTH
Culture: NO GROWTH

## 2016-08-26 ENCOUNTER — Other Ambulatory Visit: Payer: Self-pay | Admitting: Orthopedic Surgery

## 2016-08-26 DIAGNOSIS — M50122 Cervical disc disorder at C5-C6 level with radiculopathy: Secondary | ICD-10-CM

## 2016-09-01 ENCOUNTER — Ambulatory Visit
Admission: RE | Admit: 2016-09-01 | Discharge: 2016-09-01 | Disposition: A | Discharge status: Home / Self Care | Payer: BLUE CROSS/BLUE SHIELD | Source: Ambulatory Visit | Attending: Orthopedic Surgery | Admitting: Orthopedic Surgery

## 2016-09-01 DIAGNOSIS — M50222 Other cervical disc displacement at C5-C6 level: Secondary | ICD-10-CM | POA: Insufficient documentation

## 2016-09-01 DIAGNOSIS — M50122 Cervical disc disorder at C5-C6 level with radiculopathy: Secondary | ICD-10-CM | POA: Insufficient documentation

## 2016-09-01 DIAGNOSIS — M5031 Other cervical disc degeneration,  high cervical region: Secondary | ICD-10-CM | POA: Diagnosis not present

## 2016-09-12 ENCOUNTER — Other Ambulatory Visit: Payer: Self-pay | Admitting: Sports Medicine

## 2016-09-12 DIAGNOSIS — M25511 Pain in right shoulder: Secondary | ICD-10-CM

## 2016-09-16 ENCOUNTER — Ambulatory Visit: Payer: BLUE CROSS/BLUE SHIELD

## 2016-09-20 ENCOUNTER — Ambulatory Visit
Admission: RE | Admit: 2016-09-20 | Discharge: 2016-09-20 | Disposition: A | Discharge status: Home / Self Care | Payer: BLUE CROSS/BLUE SHIELD | Source: Ambulatory Visit | Attending: Sports Medicine | Admitting: Sports Medicine

## 2016-09-20 DIAGNOSIS — M7581 Other shoulder lesions, right shoulder: Secondary | ICD-10-CM | POA: Insufficient documentation

## 2016-09-20 DIAGNOSIS — M25511 Pain in right shoulder: Secondary | ICD-10-CM | POA: Insufficient documentation

## 2016-09-20 DIAGNOSIS — M19011 Primary osteoarthritis, right shoulder: Secondary | ICD-10-CM | POA: Diagnosis not present

## 2016-11-24 ENCOUNTER — Encounter: Payer: Self-pay | Admitting: Emergency Medicine

## 2016-11-24 ENCOUNTER — Emergency Department: Payer: BLUE CROSS/BLUE SHIELD

## 2016-11-24 ENCOUNTER — Emergency Department
Admission: EM | Admit: 2016-11-24 | Discharge: 2016-11-24 | Disposition: A | Discharge status: Home / Self Care | Payer: BLUE CROSS/BLUE SHIELD | Attending: Emergency Medicine | Admitting: Emergency Medicine

## 2016-11-24 DIAGNOSIS — R112 Nausea with vomiting, unspecified: Secondary | ICD-10-CM | POA: Diagnosis not present

## 2016-11-24 DIAGNOSIS — J45909 Unspecified asthma, uncomplicated: Secondary | ICD-10-CM | POA: Diagnosis not present

## 2016-11-24 DIAGNOSIS — R109 Unspecified abdominal pain: Secondary | ICD-10-CM | POA: Diagnosis present

## 2016-11-24 DIAGNOSIS — K2900 Acute gastritis without bleeding: Secondary | ICD-10-CM | POA: Insufficient documentation

## 2016-11-24 LAB — COMPREHENSIVE METABOLIC PANEL
ALT: 30 U/L (ref 14–54)
ANION GAP: 6 (ref 5–15)
AST: 29 U/L (ref 15–41)
Albumin: 3.5 g/dL (ref 3.5–5.0)
Alkaline Phosphatase: 59 U/L (ref 38–126)
BUN: 11 mg/dL (ref 6–20)
CHLORIDE: 108 mmol/L (ref 101–111)
CO2: 25 mmol/L (ref 22–32)
Calcium: 8.4 mg/dL — ABNORMAL LOW (ref 8.9–10.3)
Creatinine, Ser: 0.64 mg/dL (ref 0.44–1.00)
Glucose, Bld: 119 mg/dL — ABNORMAL HIGH (ref 65–99)
POTASSIUM: 3.6 mmol/L (ref 3.5–5.1)
Sodium: 139 mmol/L (ref 135–145)
Total Bilirubin: 0.4 mg/dL (ref 0.3–1.2)
Total Protein: 6.2 g/dL — ABNORMAL LOW (ref 6.5–8.1)

## 2016-11-24 LAB — CBC
HCT: 41.3 % (ref 35.0–47.0)
Hemoglobin: 13.8 g/dL (ref 12.0–16.0)
MCH: 29.7 pg (ref 26.0–34.0)
MCHC: 33.5 g/dL (ref 32.0–36.0)
MCV: 88.4 fL (ref 80.0–100.0)
PLATELETS: 287 10*3/uL (ref 150–440)
RBC: 4.67 MIL/uL (ref 3.80–5.20)
RDW: 12.8 % (ref 11.5–14.5)
WBC: 8.8 10*3/uL (ref 3.6–11.0)

## 2016-11-24 LAB — LIPASE, BLOOD: LIPASE: 17 U/L (ref 11–51)

## 2016-11-24 LAB — URINALYSIS, COMPLETE (UACMP) WITH MICROSCOPIC
Bacteria, UA: NONE SEEN
Bilirubin Urine: NEGATIVE
GLUCOSE, UA: NEGATIVE mg/dL
HGB URINE DIPSTICK: NEGATIVE
Ketones, ur: NEGATIVE mg/dL
LEUKOCYTES UA: NEGATIVE
NITRITE: NEGATIVE
PH: 6 (ref 5.0–8.0)
PROTEIN: 30 mg/dL — AB
SPECIFIC GRAVITY, URINE: 1.025 (ref 1.005–1.030)

## 2016-11-24 LAB — POCT PREGNANCY, URINE: Preg Test, Ur: NEGATIVE

## 2016-11-24 LAB — TROPONIN I

## 2016-11-24 MED ORDER — ONDANSETRON 4 MG PO TBDP: 4.0000 mg | ORAL_TABLET | Freq: Three times a day (TID) | ORAL | 0 refills | Status: AC | PRN

## 2016-11-24 MED ORDER — SUCRALFATE 1 G PO TABS: 1.0000 g | ORAL_TABLET | Freq: Four times a day (QID) | ORAL | 1 refills | Status: AC

## 2016-11-24 MED ORDER — PANTOPRAZOLE SODIUM 40 MG PO TBEC
40.0000 mg | DELAYED_RELEASE_TABLET | Freq: Every day | ORAL | 1 refills | Status: AC
Start: 2016-11-24 — End: 2017-11-24

## 2016-11-24 MED ORDER — GI COCKTAIL ~~LOC~~
30.0000 mL | Freq: Once | ORAL | Status: AC
  Administered 2016-11-24: 30 mL via ORAL
  Filled 2016-11-24: qty 30

## 2016-11-24 MED ORDER — TRAMADOL HCL 50 MG PO TABS: 50.0000 mg | ORAL_TABLET | Freq: Four times a day (QID) | ORAL | 0 refills | Status: AC | PRN

## 2016-11-24 MED ORDER — ONDANSETRON 4 MG PO TBDP
4.0000 mg | ORAL_TABLET | Freq: Once | ORAL | Status: AC
  Administered 2016-11-24: 4 mg via ORAL
  Filled 2016-11-24: qty 1

## 2016-11-24 NOTE — ED Provider Notes (Signed)
[facility] Emergency Department Provider Note       Time seen: ----------------------------------------- 9:41 PM on 11/24/2016 -----------------------------------------     I have reviewed the triage vital signs and the nursing notes.   HISTORY   Chief Complaint Abdominal Pain    HPI Jaclyn Wagner is a 36 y.o. female who presents to the ED for upper abdominal pain over the last month. Patient is a history of acid reflux had endoscopy year ago that was normal. Patient states pain became worse today and she's been vomiting. She's never had vomiting before with with it. Pain is 5 out of 10 in the epigastrium. She denies any diarrhea, or fever.  Past Medical History:  Diagnosis Date  . Anemia   . Arthritis    R HAND  . Asthma    AS A CHILD  . Fibroids   . GERD (gastroesophageal reflux disease)     There are no active problems to display for this patient.   Past Surgical History:  Procedure Laterality Date  . TUBAL LIGATION    . UTERINE ARTERY EMBOLIZATION  04/20/15   at [facility]    Allergies Penicillins  Social History Social History  Substance Use Topics  . Smoking status: Never Smoker  . Smokeless tobacco: Never Used  . Alcohol use 0.0 oz/week     Comment: Occassionally    Review of Systems Constitutional: Negative for fever. Cardiovascular: Negative for chest pain. Respiratory: Negative for shortness of breath. Gastrointestinal: positive for abdominal pain and vomiting Genitourinary: Negative for dysuria. Musculoskeletal: Negative for back pain. Skin: Negative for rash. Neurological: Negative for headaches, focal weakness or numbness.  All systems negative/normal/unremarkable except as stated in the HPI  ____________________________________________   PHYSICAL EXAM:  VITAL SIGNS: ED Triage Vitals  Enc Vitals Group     BP 11/24/16 2002 131/85     Pulse Rate 11/24/16 2002 (!) 102     Resp 11/24/16 2002  18     Temp 11/24/16 2002 98.1 F (36.7 C)     Temp Source 11/24/16 2002 Oral     SpO2 11/24/16 2002 97 %     Weight 11/24/16 2002 186 lb (84.4 kg)     Height 11/24/16 2002 5\' 7"  (1.702 m)     Head Circumference --      Peak Flow --      Pain Score 11/24/16 2004 5     Pain Loc --      Pain Edu? --      Excl. in [place]? --     Constitutional: Alert and oriented. Well appearing and in no distress. Eyes: Conjunctivae are normal. Normal extraocular movements. ENT   Head: Normocephalic and atraumatic.   Nose: No congestion/rhinnorhea.   Mouth/Throat: Mucous membranes are moist.   Neck: No stridor. Cardiovascular: Normal rate, regular rhythm. No murmurs, rubs, or gallops. Respiratory: Normal respiratory effort without tachypnea nor retractions. Breath sounds are clear and equal bilaterally. No wheezes/rales/rhonchi. Gastrointestinal: mild epigastric tenderness, rebound or guarding. Normal bowel sounds. Musculoskeletal: Nontender with normal range of motion in extremities. No lower extremity tenderness nor edema. Neurologic:  Normal speech and language. No gross focal neurologic deficits are appreciated.  Skin:  Skin is warm, dry and intact. No rash noted. Psychiatric: Mood and affect are normal. Speech and behavior are normal.  ___________________________________________  ED COURSE:  Pertinent labs & imaging results that were available during my care of the patient were reviewed by me and considered in my medical decision  making (see chart for details). Patient presents for abdominal pain and vomiting, we will assess with labs and imaging as indicated.   Procedures ____________________________________________   LABS (pertinent positives/negatives)  Labs Reviewed  COMPREHENSIVE METABOLIC PANEL - Abnormal; Notable for the following:       Result Value   Glucose, Bld 119 (*)    Calcium 8.4 (*)    Total Protein 6.2 (*)    All other components within normal limits   URINALYSIS, COMPLETE (UACMP) WITH MICROSCOPIC - Abnormal; Notable for the following:    Color, Urine YELLOW (*)    APPearance CLEAR (*)    Protein, ur 30 (*)    Squamous Epithelial / LPF 0-5 (*)    All other components within normal limits  CBC  LIPASE, BLOOD  TROPONIN I  H PYLORI, IGM, IGG, IGA AB  POC URINE PREG, ED  POCT PREGNANCY, URINE    RADIOLOGY  abdomen 2 view is negative  ____________________________________________  DIFFERENTIAL DIAGNOSIS   GERD, peptic ulcer disease, gastritis, pancreatitis, perforated hollow viscus, colitis, constipation, gas pain, muscle strain   FINAL ASSESSMENT AND PLAN  abdominal pain, vomiting   Plan: Patient's labs and imaging were dictated above. Patient had presented for upper abdominal pain and vomiting which still seems to be GERD related. I will change her to Protonix as well as Carafate and provide pain medicine and antiemetics. I did send off testing for H. pylori. She'll be referred to GI for outpatient follow-up.   Earleen Newport, MD   Note: This note was generated in part or whole with voice recognition software. Voice recognition is usually quite accurate but there are transcription errors that can and very often do occur. I apologize for any typographical errors that were not detected and corrected.     Earleen Newport, MD 11/24/16 2223

## 2016-11-24 NOTE — ED Triage Notes (Signed)
Pt in with co epigastric pain x 1 month, hx of reflux had an endoscopy a year ago that was normal. States pain became worse today, has been vomiting.

## 2017-04-20 ENCOUNTER — Encounter: Payer: Self-pay | Admitting: Physician Assistant

## 2017-04-20 ENCOUNTER — Other Ambulatory Visit (INDEPENDENT_AMBULATORY_CARE_PROVIDER_SITE_OTHER): Payer: BLUE CROSS/BLUE SHIELD

## 2017-04-20 ENCOUNTER — Ambulatory Visit: Payer: BLUE CROSS/BLUE SHIELD | Admitting: Physician Assistant

## 2017-04-20 VITALS — BP 102/76 | HR 62

## 2017-04-20 DIAGNOSIS — G8929 Other chronic pain: Secondary | ICD-10-CM

## 2017-04-20 DIAGNOSIS — R109 Unspecified abdominal pain: Secondary | ICD-10-CM

## 2017-04-20 DIAGNOSIS — K219 Gastro-esophageal reflux disease without esophagitis: Secondary | ICD-10-CM

## 2017-04-20 DIAGNOSIS — R1011 Right upper quadrant pain: Secondary | ICD-10-CM | POA: Diagnosis not present

## 2017-04-20 DIAGNOSIS — R140 Abdominal distension (gaseous): Secondary | ICD-10-CM

## 2017-04-20 DIAGNOSIS — R1013 Epigastric pain: Secondary | ICD-10-CM | POA: Diagnosis not present

## 2017-04-20 LAB — IGA: IGA: 158 mg/dL (ref 68–378)

## 2017-04-20 LAB — SEDIMENTATION RATE: Sed Rate: 1 mm/hr (ref 0–20)

## 2017-04-20 LAB — HIGH SENSITIVITY CRP: CRP, High Sensitivity: 0.33 mg/L (ref 0.000–5.000)

## 2017-04-20 MED ORDER — GLYCOPYRROLATE 2 MG PO TABS: ORAL_TABLET | ORAL | 6 refills | Status: AC

## 2017-04-20 NOTE — Patient Instructions (Addendum)
Please go to the basement level to have your labs drawn.  Continue Protonix 40 mg, take 1 by mouth every morning. We have sent the following medications to your pharmacy for you to pick up at your convenience: 1. Robinul Forte 2 mg  Avoid Lactose. Low Gas Diet - Handout provided. Stop Carbonated beverages.  You have been scheduled for an abdominal ultrasound at [facility]iology (1st floor of hospital) on Tuesday 04-25-17 at 12:30 PM. Please arrive 15 minutes prior to your appointment for registration. Make certain not to have anything to eat or drink 6 hours prior to your appointment. Should you need to reschedule your appointment, please contact radiology at [phone]. This test typically takes about 30 minutes to perform.   If you are age 39 or younger, your body mass index should be between 19-25. Your There is no height or weight on file to calculate BMI. If this is out of the aformentioned range listed, please consider follow up with your Primary Care Provider.

## 2017-04-20 NOTE — Progress Notes (Signed)
Subjective:    Patient ID: Jaclyn Wagner, female    DOB: Apr 22, 1980, 37 y.o.   MRN: [id number]  HPI Jaclyn Wagner is a pleasant 37 year old female, known to Dr. Ardis Hughs who was last seen here in July 2016 when she had upper endoscopy. Out was done more for reflux type symptoms she was noted to have a mild nonspecific gastritis. Biopsies showed chronic focally active gastritis or was no evidence of H. pylori. At that time she was treated with Protonix. She says those symptoms resolved. Now over the past 3-4 months she has developed upper abdominal discomfort which is usually brought on by eating and may last for an hour or so. She is generally not having any associated nausea and vomiting but has had some intermittent nausea. Appetite is been okay weight has been stable. No fever or chills. She describes the pain as sharp and stabbing at times and twisting in nature. She is still having some reflux symptoms. She had an ER visit in October 2018, did not have any imaging at that time but was told she had gastritis. She was started on Protonix 40 mg daily and also given a prescription for Carafate. She has been using the Carafate as needed, not sure if the Protonix is helping her upper abdominal symptoms. She does feel that she is lactose intolerant, also notices that lemonade and beef often aggravate her symptoms. She has ibuprofen on her med list but has not been using any NSAIDs for many months.  Review of Systems Pertinent positive and negative review of systems were noted in the above HPI section.  All other review of systems was otherwise negative.  Outpatient Encounter Medications as of 04/20/2017  Medication Sig  . HYDROcodone-acetaminophen (NORCO) 5-325 MG tablet Take 1 tablet by mouth every 6 (six) hours as needed for moderate pain.  . pantoprazole (PROTONIX) 40 MG tablet Take 1 tablet (40 mg total) by mouth daily.  . pantoprazole (PROTONIX) 40 MG tablet Take 1 tablet (40 mg total) by mouth daily.  .  sucralfate (CARAFATE) 1 g tablet Take 1 tablet (1 g total) by mouth 4 (four) times daily.  . [DISCONTINUED] ciprofloxacin (CIPRO) 500 MG tablet Take 500 mg by mouth 2 (two) times daily. X 7 days.  Marland Kitchen glycopyrrolate (ROBINUL) 2 MG tablet Take 1 tab twice daily for abd bloating/pain,cramping.  Marland Kitchen ibuprofen (ADVIL,MOTRIN) 800 MG tablet Take 1 tablet (800 mg total) by mouth every 8 (eight) hours as needed for moderate pain. (Patient not taking: Reported on 04/20/2017)  . ibuprofen (ADVIL,MOTRIN) 800 MG tablet Take 800 mg by mouth every 8 (eight) hours as needed for mild pain or moderate pain.  Marland Kitchen ondansetron (ZOFRAN ODT) 4 MG disintegrating tablet Take 1 tablet (4 mg total) by mouth every 8 (eight) hours as needed for nausea or vomiting. (Patient not taking: Reported on 04/20/2017)  . ondansetron (ZOFRAN ODT) 4 MG disintegrating tablet Take 1 tablet (4 mg total) by mouth every 8 (eight) hours as needed for nausea or vomiting. (Patient not taking: Reported on 04/20/2017)  . oxyCODONE (OXY IR/ROXICODONE) 5 MG immediate release tablet Take 5-10 mg by mouth See admin instructions. Take 1 to 2 tablets by mouth every 4 to 6 hours as needed for severe pain (level 8-10).  . promethazine (PHENERGAN) 12.5 MG tablet Take 12.5-25 mg by mouth See admin instructions. Take 1 to 2 tablets by mouth every 4 to 6 hours as needed for nausea and vomiting.  . traMADol (ULTRAM) 50 MG tablet Take 1  tablet (50 mg total) by mouth every 6 (six) hours as needed. (Patient not taking: Reported on 04/20/2017)   No facility-administered encounter medications on file as of 04/20/2017.    Allergies  Allergen Reactions  . Penicillins Rash    Has patient had a PCN reaction causing immediate rash, facial/tongue/throat swelling, SOB or lightheadedness with hypotension: Yes Has patient had a PCN reaction causing severe rash involving mucus membranes or skin necrosis: No Has patient had a PCN reaction that required hospitalization No Has patient  had a PCN reaction occurring within the last 10 years: No If all of the above answers are "NO", then may proceed with Cephalosporin use.   There are no active problems to display for this patient.  Social History   Socioeconomic History  . Marital status: Divorced    Spouse name: Not on file  . Number of children: 3  . Years of education: Not on file  . Highest education level: Not on file  Social Needs  . Financial resource strain: Not on file  . Food insecurity - worry: Not on file  . Food insecurity - inability: Not on file  . Transportation needs - medical: Not on file  . Transportation needs - non-medical: Not on file  Occupational History  . Occupation: Librarian, academic  Tobacco Use  . Smoking status: Never Smoker  . Smokeless tobacco: Never Used  Substance and Sexual Activity  . Alcohol use: Yes    Alcohol/week: 0.0 oz    Comment: Occassionally  . Drug use: No  . Sexual activity: Not on file  Other Topics Concern  . Not on file  Social History Narrative  . Not on file    [name]'s family history includes Colon polyps in her maternal aunt, maternal grandmother, and sister; Esophageal cancer in her paternal grandmother; Ovarian cancer in her maternal grandmother and mother.      Objective:    Vitals:   04/20/17 0925  BP: 102/76  Pulse: 62    Physical Exam well-developed female in no acute distress, pleasant blood pressure 102/76 pulse 62, HEENT; nontraumatic normocephalic EOMI PERRLA sclera anicteric, Cardiovascular; regular rate and rhythm with S1-S2 no murmur or gallop, Pulm; clear bilaterally, Abdomen; soft, minimal epigastric tenderness there is no guarding or rebound no palpable mass or hepatosplenomegaly bowel sounds are present, Rectal; exam not done, EXt;no clubbing cyanosis or edema skin warm dry, Neuropsych; mood and affect appropriate       Assessment & Plan:   #55 37 year old female with several month history of epigastric and right upper quadrant  discomfort worse postprandially, and intermittent nausea. Patient has been taking Protonix which has not improved her symptoms. Patient does have prior history of gastritis.  #2 cervical disc disease #3 lactose intolerance  Plan; Will check TTG, IgA, sedimentation rate and CRP Schedule for upper abdominal ultrasound Continue Protonix 40 mg by mouth every morning before meals breakfast Start Robinul Forte 2 mg 1 by mouth twice a day. We discussed avoidance of lactose and any other trigger foods, and she was also given a copy of a low gas diet as symptoms are associated with bloating. Further plans pending results of above.  Amardeep Beckers S Aariah Godette PA-C 04/20/2017   Cc: No ref. provider found

## 2017-04-24 NOTE — Progress Notes (Signed)
I agree with the above note, plan 

## 2017-04-25 ENCOUNTER — Ambulatory Visit (HOSPITAL_COMMUNITY)
Admission: RE | Admit: 2017-04-25 | Discharge: 2017-04-25 | Disposition: A | Discharge status: Home / Self Care | Payer: BLUE CROSS/BLUE SHIELD | Source: Ambulatory Visit | Attending: Physician Assistant | Admitting: Physician Assistant

## 2017-04-25 DIAGNOSIS — G8929 Other chronic pain: Secondary | ICD-10-CM | POA: Diagnosis not present

## 2017-04-25 DIAGNOSIS — K219 Gastro-esophageal reflux disease without esophagitis: Secondary | ICD-10-CM | POA: Insufficient documentation

## 2017-04-25 DIAGNOSIS — R1013 Epigastric pain: Secondary | ICD-10-CM | POA: Diagnosis not present

## 2017-04-25 DIAGNOSIS — N200 Calculus of kidney: Secondary | ICD-10-CM | POA: Insufficient documentation

## 2017-04-25 DIAGNOSIS — R1011 Right upper quadrant pain: Secondary | ICD-10-CM

## 2017-04-25 DIAGNOSIS — R109 Unspecified abdominal pain: Secondary | ICD-10-CM

## 2017-04-25 DIAGNOSIS — R140 Abdominal distension (gaseous): Secondary | ICD-10-CM

## 2017-04-27 ENCOUNTER — Telehealth: Payer: Self-pay

## 2017-04-27 LAB — TISSUE TRANSGLUTAMINASE, IGG: (TTG) AB, IGG: 1 U/mL

## 2017-04-27 NOTE — Telephone Encounter (Signed)
-----   Message from Alfredia Ferguson, [place] sent at 04/26/2017 12:28 PM EST ----- Please let pt know her [place] was negative - no gallstones - she does have tiny stones up in both kidneys - which are not causing any problem currently -just to be aware for the future

## 2017-10-28 ENCOUNTER — Other Ambulatory Visit: Payer: Self-pay

## 2017-10-28 ENCOUNTER — Emergency Department
Admission: EM | Admit: 2017-10-28 | Discharge: 2017-10-28 | Disposition: A | Discharge status: Home / Self Care | Payer: BLUE CROSS/BLUE SHIELD | Attending: Emergency Medicine | Admitting: Emergency Medicine

## 2017-10-28 DIAGNOSIS — R51 Headache: Secondary | ICD-10-CM | POA: Insufficient documentation

## 2017-10-28 DIAGNOSIS — Z79899 Other long term (current) drug therapy: Secondary | ICD-10-CM | POA: Diagnosis not present

## 2017-10-28 DIAGNOSIS — J45909 Unspecified asthma, uncomplicated: Secondary | ICD-10-CM | POA: Diagnosis not present

## 2017-10-28 DIAGNOSIS — R519 Headache, unspecified: Secondary | ICD-10-CM

## 2017-10-28 LAB — COMPREHENSIVE METABOLIC PANEL
ALBUMIN: 4.1 g/dL (ref 3.5–5.0)
ALK PHOS: 74 U/L (ref 38–126)
ALT: 19 U/L (ref 0–44)
AST: 19 U/L (ref 15–41)
Anion gap: 5 (ref 5–15)
BUN: 9 mg/dL (ref 6–20)
CALCIUM: 8.8 mg/dL — AB (ref 8.9–10.3)
CO2: 29 mmol/L (ref 22–32)
CREATININE: 0.68 mg/dL (ref 0.44–1.00)
Chloride: 106 mmol/L (ref 98–111)
GFR calc Af Amer: >60 mL/min (ref >60)
GFR calc non Af Amer: >60 mL/min (ref >60)
GLUCOSE: 114 mg/dL — AB (ref 70–99)
Potassium: 3.7 mmol/L (ref 3.5–5.1)
Sodium: 140 mmol/L (ref 135–145)
Total Bilirubin: 0.7 mg/dL (ref 0.3–1.2)
Total Protein: 7.4 g/dL (ref 6.5–8.1)

## 2017-10-28 LAB — URINALYSIS, COMPLETE (UACMP) WITH MICROSCOPIC
BILIRUBIN URINE: NEGATIVE
Bacteria, UA: NONE SEEN
GLUCOSE, UA: NEGATIVE mg/dL
HGB URINE DIPSTICK: NEGATIVE
Ketones, ur: NEGATIVE mg/dL
NITRITE: NEGATIVE
PH: 6 (ref 5.0–8.0)
Protein, ur: NEGATIVE mg/dL
SPECIFIC GRAVITY, URINE: 1.018 (ref 1.005–1.030)

## 2017-10-28 LAB — CBC WITH DIFFERENTIAL/PLATELET
BASOS PCT: 0 %
Basophils Absolute: 0 10*3/uL (ref 0–0.1)
EOS ABS: 0 10*3/uL (ref 0–0.7)
EOS PCT: 1 %
HEMATOCRIT: 37.1 % (ref 35.0–47.0)
Hemoglobin: 13 g/dL (ref 12.0–16.0)
Lymphocytes Relative: 31 %
Lymphs Abs: 2.5 10*3/uL (ref 1.0–3.6)
MCH: 31 pg (ref 26.0–34.0)
MCHC: 35 g/dL (ref 32.0–36.0)
MCV: 88.6 fL (ref 80.0–100.0)
MONO ABS: 0.7 10*3/uL (ref 0.2–0.9)
MONOS PCT: 9 %
NEUTROS ABS: 4.8 10*3/uL (ref 1.4–6.5)
Neutrophils Relative %: 59 %
Platelets: 333 10*3/uL (ref 150–440)
RBC: 4.19 MIL/uL (ref 3.80–5.20)
RDW: 12.6 % (ref 11.5–14.5)
WBC: 8.1 10*3/uL (ref 3.6–11.0)

## 2017-10-28 NOTE — ED Triage Notes (Addendum)
FIRST NURSE NOTE-here for headache, generalized weakness, fatigue, and "just don't feel right". Ambulatory, alert and answering questions appropriately. NAD

## 2017-10-28 NOTE — ED Triage Notes (Signed)
Pt states "I just don't feel right. My head feels sluggish. I feel foggy." states symptoms x 2 hour. Alert, oriented. Ambulatory. Speech clear. Moving all extremities on own. No facial droop noted. Answering all questions appropriately.

## 2017-10-28 NOTE — ED Provider Notes (Signed)
[facility] Emergency Department Provider Note   ____________________________________________    I have reviewed the triage vital signs and the nursing notes.   HISTORY  Chief Complaint Headache     HPI Jaclyn Wagner is a 37 y.o. female who presents with complaints of a headache which has now resolved.  Patient reports 2 hours ago she developed a frontal headache which she describes as throbbing.  No nausea or vomiting.  No neuro deficits.  She is concerned that this may be related to her blood pressure and decided to come to the emergency department.  Her blood pressure here is overall unremarkable.  She denies a history of blood pressure related headaches.  She states headache is improved and she now feels quite well.  She did not take anything for this.   Past Medical History:  Diagnosis Date  . Anemia   . Arthritis    R HAND  . Asthma    AS A CHILD  . Fibroids   . GERD (gastroesophageal reflux disease)     There are no active problems to display for this patient.   Past Surgical History:  Procedure Laterality Date  . TUBAL LIGATION    . UTERINE ARTERY EMBOLIZATION  04/20/15   at [facility]    Prior to Admission medications   Medication Sig Start Date End Date Taking? Authorizing Provider  glycopyrrolate (ROBINUL) 2 MG tablet Take 1 tab twice daily for abd bloating/pain,cramping. 04/20/17   Esterwood, Amy S, PA-C  HYDROcodone-acetaminophen (NORCO) 5-325 MG tablet Take 1 tablet by mouth every 6 (six) hours as needed for moderate pain. 04/25/15   Paulette Blanch, MD  ibuprofen (ADVIL,MOTRIN) 800 MG tablet Take 1 tablet (800 mg total) by mouth every 8 (eight) hours as needed for moderate pain. Patient not taking: Reported on 04/20/2017 04/25/15   Paulette Blanch, MD  ibuprofen (ADVIL,MOTRIN) 800 MG tablet Take 800 mg by mouth every 8 (eight) hours as needed for mild pain or moderate pain.    [provider]  ondansetron (ZOFRAN  ODT) 4 MG disintegrating tablet Take 1 tablet (4 mg total) by mouth every 8 (eight) hours as needed for nausea or vomiting. Patient not taking: Reported on 04/20/2017 04/25/15   Paulette Blanch, MD  ondansetron (ZOFRAN ODT) 4 MG disintegrating tablet Take 1 tablet (4 mg total) by mouth every 8 (eight) hours as needed for nausea or vomiting. Patient not taking: Reported on 04/20/2017 11/24/16   Earleen Newport, MD  oxyCODONE (OXY IR/ROXICODONE) 5 MG immediate release tablet Take 5-10 mg by mouth See admin instructions. Take 1 to 2 tablets by mouth every 4 to 6 hours as needed for severe pain (level 8-10). 04/21/15   [provider]  pantoprazole (PROTONIX) 40 MG tablet Take 1 tablet (40 mg total) by mouth daily. 07/18/14   Milus Banister, MD  pantoprazole (PROTONIX) 40 MG tablet Take 1 tablet (40 mg total) by mouth daily. 11/24/16 11/24/17  Earleen Newport, MD  promethazine (PHENERGAN) 12.5 MG tablet Take 12.5-25 mg by mouth See admin instructions. Take 1 to 2 tablets by mouth every 4 to 6 hours as needed for nausea and vomiting. 04/21/15   [provider]  sucralfate (CARAFATE) 1 g tablet Take 1 tablet (1 g total) by mouth 4 (four) times daily. 11/24/16 11/24/17  Earleen Newport, MD  traMADol (ULTRAM) 50 MG tablet Take 1 tablet (50 mg total) by mouth every 6 (six) hours as  needed. Patient not taking: Reported on 04/20/2017 11/24/16 11/24/17  Earleen Newport, MD     Allergies Penicillins  Family History  Problem Relation Age of Onset  . Ovarian cancer Mother   . Ovarian cancer Maternal Grandmother   . Colon polyps Maternal Grandmother   . Esophageal cancer Paternal Grandmother   . Colon polyps Maternal Aunt        x3  . Colon polyps Sister   . Colon cancer Neg Hx   . Diabetes Neg Hx   . Gallbladder disease Neg Hx   . Heart disease Neg Hx     Social History Social History   Tobacco Use  . Smoking status: Never Smoker  . Smokeless tobacco: Never Used  Substance  Use Topics  . Alcohol use: Yes    Alcohol/week: 0.0 standard drinks    Comment: Occassionally  . Drug use: No    Review of Systems  Constitutional: No dizziness Eyes: No visual changes.  ENT: No neck pain Cardiovascular: Denies chest pain. Respiratory: Denies shortness of breath. GastrointestinalNo nausea, no vomiting.   Genitourinary: No difficulty urinating Musculoskeletal: Negative for neck pain Skin: Negative for rash. Neurological: As above   ____________________________________________   PHYSICAL EXAM:  VITAL SIGNS: ED Triage Vitals  Enc Vitals Group     BP 10/28/17 1558 (!) 162/89     Pulse Rate 10/28/17 1558 (!) 105     Resp 10/28/17 1558 16     Temp 10/28/17 1558 99 F (37.2 C)     Temp Source 10/28/17 1558 Oral     SpO2 10/28/17 1558 100 %     Weight 10/28/17 1559 84.4 kg (186 lb)     Height 10/28/17 1559 1.676 m (5\' 6" )     Head Circumference --      Peak Flow --      Pain Score 10/28/17 1559 6     Pain Loc --      Pain Edu? --      Excl. in [place]? --     Constitutional: Alert and oriented. No acute distress. Pleasant and interactive Eyes: Conjunctivae are normal.  PERRLA, EOMI   Mouth/Throat: Mucous membranes are moist.   Neck:  Painless ROM Cardiovascular: Normal rate, regular rhythm. Grossly normal heart sounds.  Good peripheral circulation. Respiratory: Normal respiratory effort.  No retractions. Lungs CTAB. Gastrointestinal: Soft and nontender. No distention. .  Musculoskeletal: No lower extremity tenderness nor edema.  Warm and well perfused Neurologic:  Normal speech and language. No gross focal neurologic deficits are appreciated.  Cranial nerves II through XII are normal Skin:  Skin is warm, dry and intact. No rash noted. Psychiatric: Mood and affect are normal. Speech and behavior are normal.  ____________________________________________   LABS (all labs ordered are listed, but only abnormal results are displayed)  Labs Reviewed    COMPREHENSIVE METABOLIC PANEL - Abnormal; Notable for the following components:      Result Value   Glucose, Bld 114 (*)    Calcium 8.8 (*)    All other components within normal limits  URINALYSIS, COMPLETE (UACMP) WITH MICROSCOPIC - Abnormal; Notable for the following components:   Color, Urine YELLOW (*)    APPearance CLEAR (*)    Leukocytes, UA TRACE (*)    All other components within normal limits  CBC WITH DIFFERENTIAL/PLATELET  POC URINE PREG, ED   ____________________________________________  EKG   ____________________________________________  RADIOLOGY  None ____________________________________________   PROCEDURES  Procedure(s) performed: No  Procedures  Critical Care performed: No ____________________________________________   INITIAL IMPRESSION / ASSESSMENT AND PLAN / ED COURSE  Pertinent labs & imaging results that were available during my care of the patient were reviewed by me and considered in my medical decision making (see chart for details).  Patient with brief headache, now resolved, no neuro deficits or concerning symptoms.  She feels better and is reassured by improved blood pressure, appropriate for discharge at this time with outpatient follow-up.    ____________________________________________   FINAL CLINICAL IMPRESSION(S) / ED DIAGNOSES  Final diagnoses:  Acute nonintractable headache, unspecified headache type        Note:  This document was prepared using Dragon voice recognition software and may include unintentional dictation errors.    Lavonia Drafts, MD 10/28/17 2021

## 2018-12-30 IMAGING — MR MR CERVICAL SPINE W/O CM
5 series · 34 of 48 positions shown · non-contrast
Comparison: None available.

CLINICAL DATA: Initial evaluation for right-sided neck and shoulder
pain radiating into right upper extremity with numbness and tingling
for 2 weeks.

EXAM:
MRI CERVICAL SPINE WITHOUT CONTRAST
TECHNIQUE: Multiplanar, multisequence MR imaging of the cervical spine was
performed. No intravenous contrast was administered.

[Series 2: T2 · sagittal · 3.0mm · 0.70mm/px · 7 of 13 slices shown (1 of 2)]
[im 1/13]
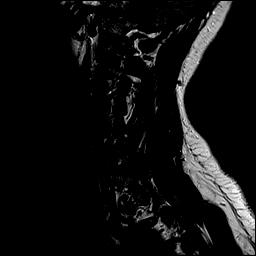
[im 3/13]
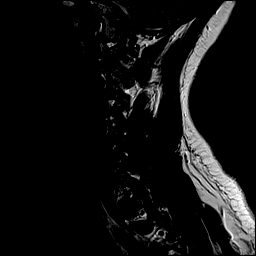
[im 5/13]
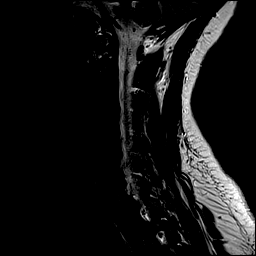
[im 7/13]
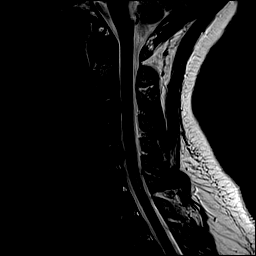
[im 9/13]
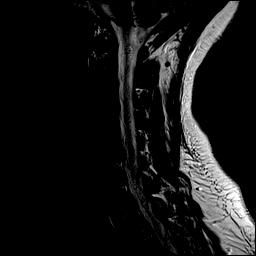
[im 11/13]
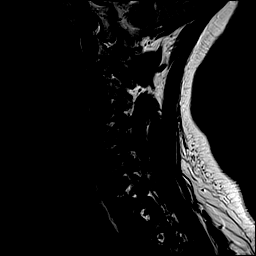
[im 13/13]
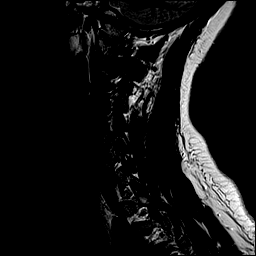

[Series 3: T1 · sagittal · 3.0mm · 0.70mm/px · 7 of 13 slices shown]
[im 1/13]
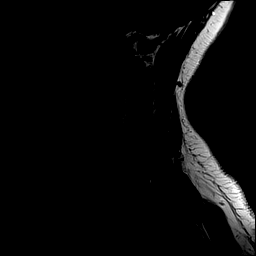
[im 3/13]
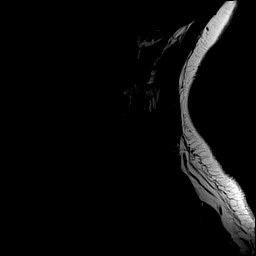
[im 5/13]
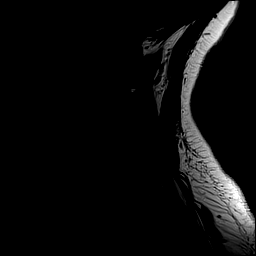
[im 7/13]
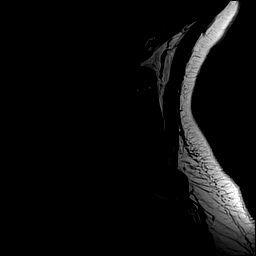
[im 9/13]
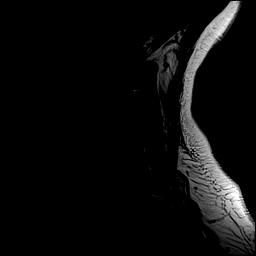
[im 11/13]
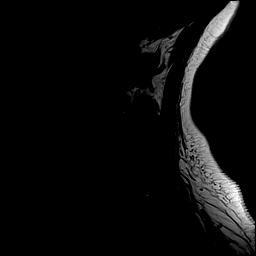
[im 13/13]
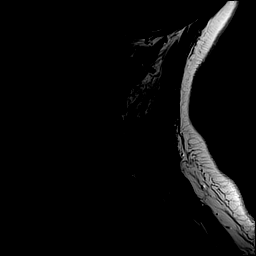

[Series 4: STIR · sagittal · 3.0mm · 0.35mm/px · 6 of 13 slices shown]
[im 1/13]
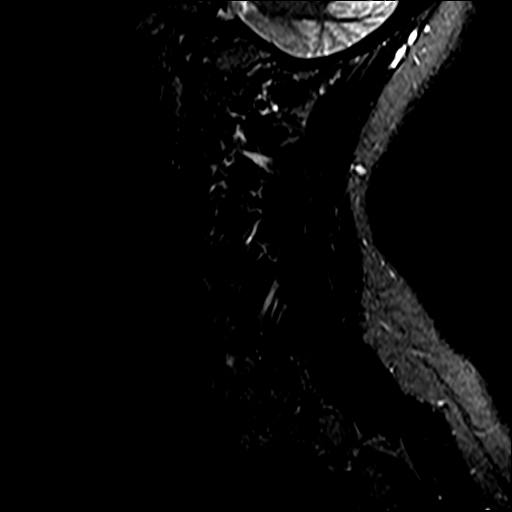
[im 3/13]
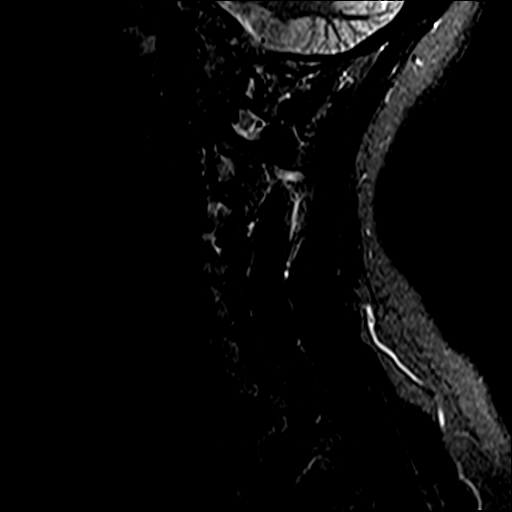
[im 5/13]
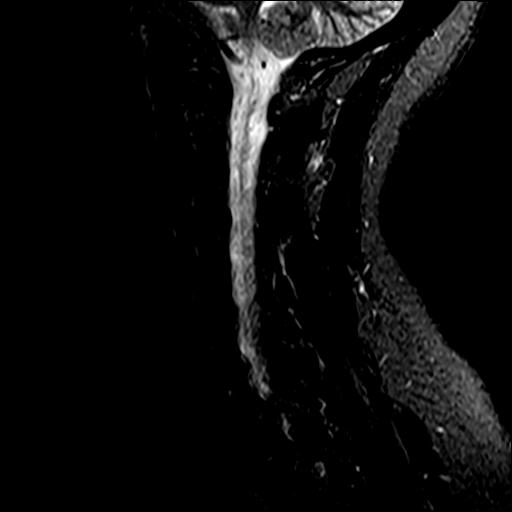
[im 8/13]
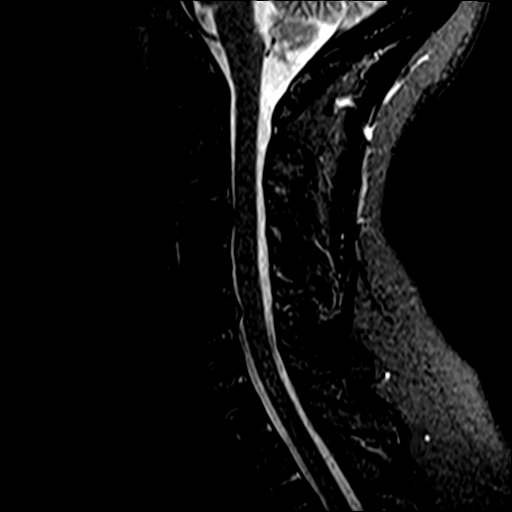
[im 10/13]
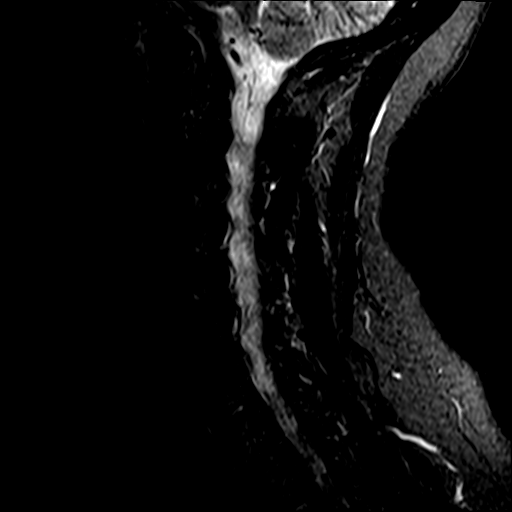
[im 13/13]
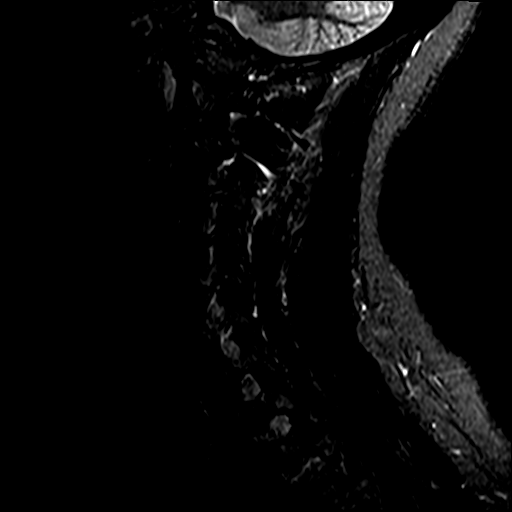

[Series 5: T2 · axial · 3.0mm · 0.70mm/px · z∈[-60,+46]mm · 8 of 29 slices shown (2 of 2)]
[im 1/29]
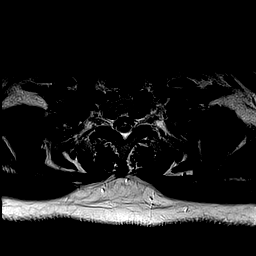
[im 5/29]
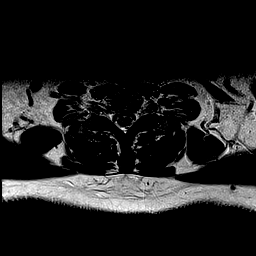
[im 9/29]
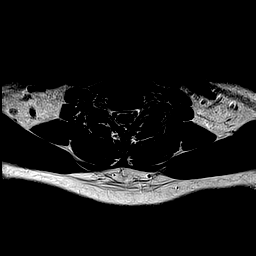
[im 13/29]
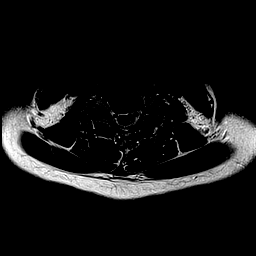
[im 16/29]
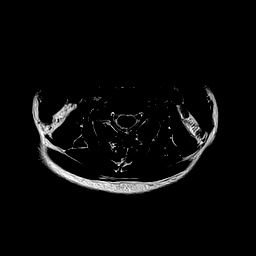
[im 20/29]
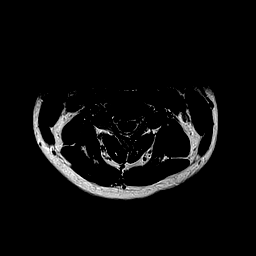
[im 24/29]
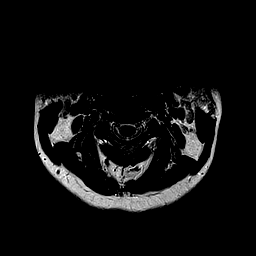
[im 29/29]
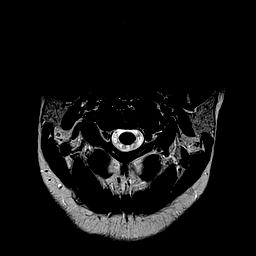

[Series 6: mpgr ax · axial · 3.0mm · 0.35mm/px · z∈[-60,+12]mm · 6 of 29 slices shown]
[im 1/29]
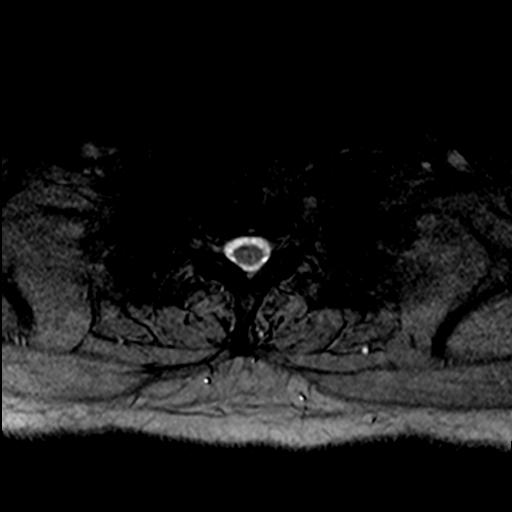
[im 5/29]
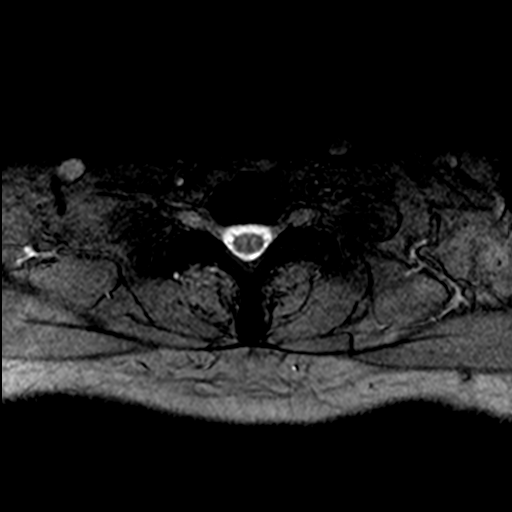
[im 9/29]
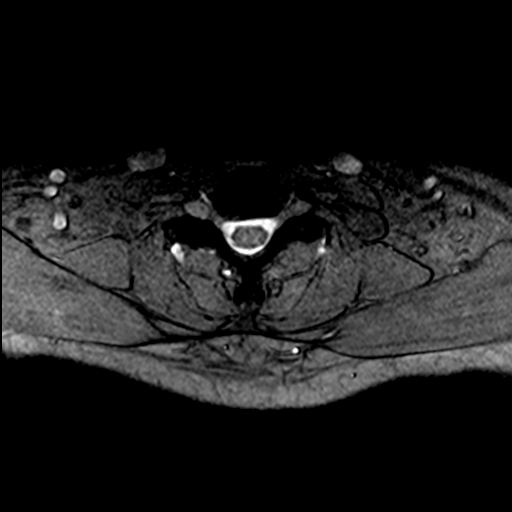
[im 13/29]
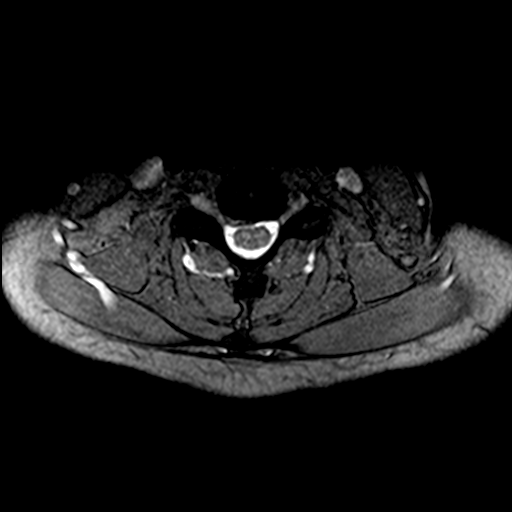
[im 16/29]
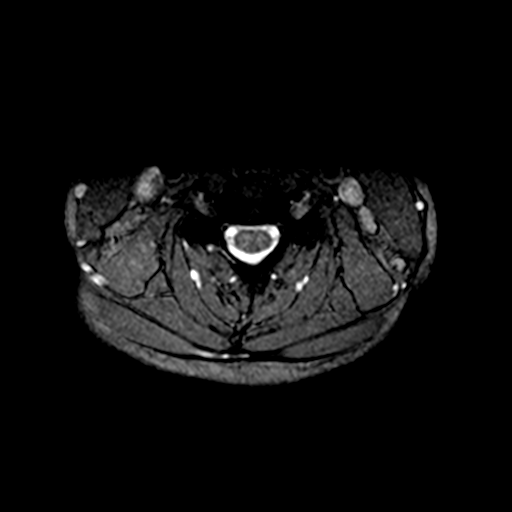
[im 20/29]
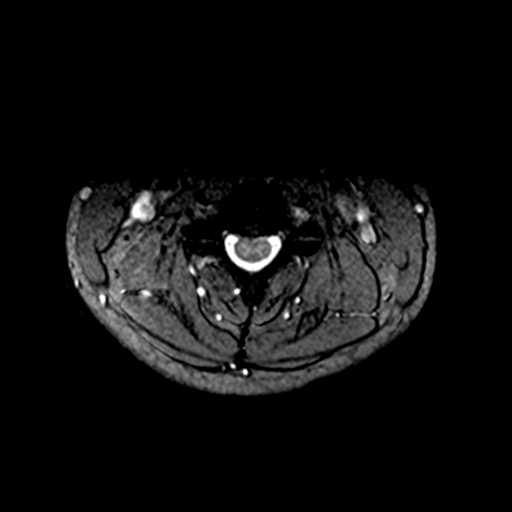

[34 of 48 positions shown; findings below may reference images not displayed]

FINDINGS: Alignment: Vertebral bodies normally aligned with preservation of
the normal cervical lordosis. No listhesis.

Vertebrae: Vertebral body heights are maintained. No evidence for
acute or chronic fracture. Signal intensity within the vertebral
body bone marrow within normal limits. No discrete or worrisome
osseous lesions. No abnormal marrow edema.

Cord: Signal intensity within the cervical spinal cord is normal.

Posterior Fossa, vertebral arteries, paraspinal tissues: Visualized
brain and posterior fossa are within normal limits. Craniocervical
junction normal. Paraspinous and prevertebral soft tissues within
normal limits. Normal intravascular flow voids present within the
vertebral arteries bilaterally.

Disc levels:

C2-C3: Unremarkable.

C3-C4: Shallow posterior disc bulge mildly indents the ventral
thecal sac. No significant canal stenosis. Foramina remain widely
patent.

C4-C5:  Unremarkable.

C5-C6: Right central disc protrusion flattens the right ventral
thecal sac, extending laterally towards the right neural foramen
(series 6, image 18). Protruding disc mildly flattens the right hemi
cord without cord signal changes. Mild right C6 foraminal stenosis.
No significant left foraminal encroachment.

C6-C7:  Unremarkable.

C7-T1:  Unremarkable.

Visualized upper thoracic spine within normal limits.
IMPRESSION: 1. Right central disc protrusion at C5-6, abutting and mildly
flattening the right hemi cord without cord signal changes. Finding
could potentially affect the right C6 nerve root.
2. Mild degenerative disc bulge at C3-4 without stenosis.

## 2019-03-15 ENCOUNTER — Ambulatory Visit: Payer: Managed Care, Other (non HMO) | Attending: Internal Medicine

## 2019-03-15 DIAGNOSIS — Z20822 Contact with and (suspected) exposure to covid-19: Secondary | ICD-10-CM

## 2019-03-16 LAB — NOVEL CORONAVIRUS, NAA: SARS-CoV-2, NAA: NOT DETECTED

## 2019-11-05 ENCOUNTER — Other Ambulatory Visit: Payer: Managed Care, Other (non HMO)

## 2019-11-17 ENCOUNTER — Emergency Department: Payer: Managed Care, Other (non HMO)

## 2019-11-17 ENCOUNTER — Other Ambulatory Visit: Payer: Self-pay

## 2019-11-17 ENCOUNTER — Emergency Department
Admission: EM | Admit: 2019-11-17 | Discharge: 2019-11-17 | Disposition: A | Discharge status: Home / Self Care | Payer: Managed Care, Other (non HMO) | Attending: Emergency Medicine | Admitting: Emergency Medicine

## 2019-11-17 DIAGNOSIS — J45909 Unspecified asthma, uncomplicated: Secondary | ICD-10-CM | POA: Diagnosis not present

## 2019-11-17 DIAGNOSIS — I10 Essential (primary) hypertension: Secondary | ICD-10-CM | POA: Diagnosis not present

## 2019-11-17 DIAGNOSIS — R519 Headache, unspecified: Secondary | ICD-10-CM | POA: Diagnosis present

## 2019-11-17 LAB — BASIC METABOLIC PANEL
Anion gap: 6 (ref 5–15)
BUN: 13 mg/dL (ref 6–20)
CO2: 29 mmol/L (ref 22–32)
Calcium: 9 mg/dL (ref 8.9–10.3)
Chloride: 105 mmol/L (ref 98–111)
Creatinine, Ser: 0.82 mg/dL (ref 0.44–1.00)
GFR calc Af Amer: >60 mL/min (ref >60)
GFR calc non Af Amer: >60 mL/min (ref >60)
Glucose, Bld: 109 mg/dL — ABNORMAL HIGH (ref 70–99)
Potassium: 3.9 mmol/L (ref 3.5–5.1)
Sodium: 140 mmol/L (ref 135–145)

## 2019-11-17 LAB — CBC WITH DIFFERENTIAL/PLATELET
Abs Immature Granulocytes: 0.03 10*3/uL (ref 0.00–0.07)
Basophils Absolute: 0 10*3/uL (ref 0.0–0.1)
Basophils Relative: 0 %
Eosinophils Absolute: 0 10*3/uL (ref 0.0–0.5)
Eosinophils Relative: 1 %
HCT: 37.8 % (ref 36.0–46.0)
Hemoglobin: 12.3 g/dL (ref 12.0–15.0)
Immature Granulocytes: 0 %
Lymphocytes Relative: 44 %
Lymphs Abs: 3 10*3/uL (ref 0.7–4.0)
MCH: 29.2 pg (ref 26.0–34.0)
MCHC: 32.5 g/dL (ref 30.0–36.0)
MCV: 89.8 fL (ref 80.0–100.0)
Monocytes Absolute: 0.5 10*3/uL (ref 0.1–1.0)
Monocytes Relative: 7 %
Neutro Abs: 3.2 10*3/uL (ref 1.7–7.7)
Neutrophils Relative %: 48 %
Platelets: 359 10*3/uL (ref 150–400)
RBC: 4.21 MIL/uL (ref 3.87–5.11)
RDW: 11.9 % (ref 11.5–15.5)
WBC: 6.7 10*3/uL (ref 4.0–10.5)
nRBC: 0 % (ref 0.0–0.2)

## 2019-11-17 MED ORDER — BUTALBITAL-APAP-CAFFEINE 50-325-40 MG PO TABS
1.0000 | ORAL_TABLET | Freq: Once | ORAL | Status: DC
  Filled 2019-11-17: qty 1

## 2019-11-17 MED ORDER — HYDROCHLOROTHIAZIDE 25 MG PO TABS
25.0000 mg | ORAL_TABLET | Freq: Once | ORAL | Status: AC
Start: 2019-11-17 — End: 2019-11-17
  Administered 2019-11-17: 19:00:00 25 mg via ORAL
  Filled 2019-11-17: qty 1

## 2019-11-17 MED ORDER — HYDROCHLOROTHIAZIDE 25 MG PO TABS: 25.0000 mg | ORAL_TABLET | Freq: Every day | ORAL | 0 refills | Status: AC

## 2019-11-17 NOTE — ED Notes (Signed)
Pt with c/o headache x 2 days.

## 2019-11-17 NOTE — ED Triage Notes (Signed)
Pt to the er for symptomatic HTN. Pt has not been dx with HTN. Pt reports pain behind the right eye, headache that has moved from left to right. Pt denies high salt intake.

## 2019-11-17 NOTE — ED Provider Notes (Signed)
[facility] Emergency Department Provider Note ____________________________________________   First MD Initiated Contact with Patient 11/17/19 1846     (approximate)  I have reviewed the triage vital signs and the nursing notes.   HISTORY  Chief Complaint Headache  HPI Jaclyn Wagner is a 39 y.o. female with history of intermittent hypertension presents to the emergency department for treatment and evaluation due to headache and hypertension since yesterday. She states that she and her PCP had thought BP elevation was "[place]ar[place] coat syndrome," but over the past couple of days her systolic has been over 628 and only got as low as 160. She denies chest pain or other symptoms, just headache.         Past Medical History:  Diagnosis Date  . Anemia   . Arthritis    R HAND  . Asthma    AS A CHILD  . Fibroids   . GERD (gastroesophageal reflux disease)     There are no problems to display for this patient.   Past Surgical History:  Procedure Laterality Date  . TUBAL LIGATION    . UTERINE ARTERY EMBOLIZATION  04/20/15   at [facility]    Prior to Admission medications   Medication Sig Start Date End Date Taking? Authorizing Provider  glycopyrrolate (ROBINUL) 2 MG tablet Take 1 tab twice daily for abd bloating/pain,cramping. 04/20/17   Esterwood, Amy S, PA-C  hydrochlorothiazide (HYDRODIURIL) 25 MG tablet Take 1 tablet (25 mg total) by mouth daily. 11/17/19 12/17/19  Domanic Matusek, Johnette Abraham B, FNP  HYDROcodone-acetaminophen (NORCO) 5-325 MG tablet Take 1 tablet by mouth every 6 (six) hours as needed for moderate pain. 04/25/15   Paulette Blanch, MD  ibuprofen (ADVIL,MOTRIN) 800 MG tablet Take 1 tablet (800 mg total) by mouth every 8 (eight) hours as needed for moderate pain. Patient not taking: Reported on 04/20/2017 04/25/15   Paulette Blanch, MD  ibuprofen (ADVIL,MOTRIN) 800 MG tablet Take 800 mg by mouth every 8 (eight) hours as needed for mild pain or moderate  pain.    [provider]  ondansetron (ZOFRAN ODT) 4 MG disintegrating tablet Take 1 tablet (4 mg total) by mouth every 8 (eight) hours as needed for nausea or vomiting. Patient not taking: Reported on 04/20/2017 04/25/15   Paulette Blanch, MD  ondansetron (ZOFRAN ODT) 4 MG disintegrating tablet Take 1 tablet (4 mg total) by mouth every 8 (eight) hours as needed for nausea or vomiting. Patient not taking: Reported on 04/20/2017 11/24/16   Earleen Newport, MD  oxyCODONE (OXY IR/ROXICODONE) 5 MG immediate release tablet Take 5-10 mg by mouth See admin instructions. Take 1 to 2 tablets by mouth every 4 to 6 hours as needed for severe pain (level 8-10). 04/21/15   [provider]  pantoprazole (PROTONIX) 40 MG tablet Take 1 tablet (40 mg total) by mouth daily. 07/18/14   Milus Banister, MD  pantoprazole (PROTONIX) 40 MG tablet Take 1 tablet (40 mg total) by mouth daily. 11/24/16 11/24/17  Earleen Newport, MD  promethazine (PHENERGAN) 12.5 MG tablet Take 12.5-25 mg by mouth See admin instructions. Take 1 to 2 tablets by mouth every 4 to 6 hours as needed for nausea and vomiting. 04/21/15   [provider]  sucralfate (CARAFATE) 1 g tablet Take 1 tablet (1 g total) by mouth 4 (four) times daily. 11/24/16 11/24/17  Earleen Newport, MD    Allergies Penicillins  Family History  Problem Relation Age of Onset  .  Ovarian cancer Mother   . Ovarian cancer Maternal Grandmother   . Colon polyps Maternal Grandmother   . Esophageal cancer Paternal Grandmother   . Colon polyps Maternal Aunt        x3  . Colon polyps Sister   . Colon cancer Neg Hx   . Diabetes Neg Hx   . Gallbladder disease Neg Hx   . Heart disease Neg Hx     Social History Social History   Tobacco Use  . Smoking status: Never Smoker  . Smokeless tobacco: Never Used  Vaping Use  . Vaping Use: Never used  Substance Use Topics  . Alcohol use: Yes    Alcohol/week: 0.0 standard drinks    Comment:  Occassionally  . Drug use: No    Review of Systems  Constitutional: No fever/chills Eyes: No visual changes. ENT: No sore throat. Cardiovascular: Denies chest pain. Respiratory: Denies shortness of breath. Gastrointestinal: No abdominal pain.  No nausea, no vomiting.  No diarrhea.  No constipation. Genitourinary: Negative for dysuria. Musculoskeletal: Negative for back pain. Skin: Negative for rash. Neurological: Positive for headaches, negative for focal weakness or numbness. ____________________________________________   PHYSICAL EXAM:  VITAL SIGNS: ED Triage Vitals  Enc Vitals Group     BP 11/17/19 1604 (!) 165/101     Pulse Rate 11/17/19 1604 78     Resp 11/17/19 1604 18     Temp 11/17/19 1604 98.3 F (36.8 C)     Temp Source 11/17/19 1604 Oral     SpO2 11/17/19 1604 99 %     Weight 11/17/19 1605 188 lb (85.3 kg)     Height 11/17/19 1605 5' 6.25" (1.683 m)     Head Circumference --      Peak Flow --      Pain Score 11/17/19 1605 5     Pain Loc --      Pain Edu? --      Excl. in [place]? --     Constitutional: Alert and oriented. Well appearing and in no acute distress. Eyes: Conjunctivae are normal. Head: Atraumatic. Nose: No congestion/rhinnorhea. Mouth/Throat: Mucous membranes are moist.  Oropharynx non-erythematous. Neck: No stridor.   Hematological/Lymphatic/Immunilogical: No cervical lymphadenopathy. Cardiovascular: Normal rate, regular rhythm. Grossly normal heart sounds.  Good peripheral circulation. Respiratory: Normal respiratory effort.  No retractions. Lungs CTAB. Gastrointestinal: Soft and nontender. No distention. No abdominal bruits. No CVA tenderness. Genitourinary:  Musculoskeletal: No lower extremity tenderness nor edema.  No joint effusions. Neurologic:  Normal speech and language. No gross focal neurologic deficits are appreciated. No gait instability. Skin:  Skin is warm, dry and intact. No rash noted. Psychiatric: Mood and affect are normal.  Speech and behavior are normal.  ____________________________________________   LABS (all labs ordered are listed, but only abnormal results are displayed)  Labs Reviewed  BASIC METABOLIC PANEL - Abnormal; Notable for the following components:      Result Value   Glucose, Bld 109 (*)    All other components within normal limits  CBC WITH DIFFERENTIAL/PLATELET   ____________________________________________  EKG  Not indicated. ____________________________________________  RADIOLOGY  ED MD interpretation:    Head CT negative.  I, Sherrie George, personally viewed and evaluated these images (plain radiographs) as part of my medical decision making, as well as reviewing the written report by the radiologist.  Official radiology report(s): CT Head Wo Contrast  Result Date: 11/17/2019 CLINICAL DATA:  Headache. EXAM: CT HEAD WITHOUT CONTRAST TECHNIQUE: Contiguous axial images were obtained from the base  of the skull through the vertex without intravenous contrast. COMPARISON:  None. FINDINGS: Brain: No evidence of acute infarction, hemorrhage, hydrocephalus, extra-axial collection or mass lesion/mass effect. Vascular: No hyperdense vessel or unexpected calcification. Skull: Normal. Negative for fracture or focal lesion. Sinuses/Orbits: No acute finding. Other: None. IMPRESSION: No acute intracranial abnormality. Electronically Signed   By: Fidela Salisbury M.D.   On: 11/17/2019 16:29    ____________________________________________   PROCEDURES  Procedure(s) performed (including Critical Care):  Procedures  ____________________________________________   INITIAL IMPRESSION / ASSESSMENT AND PLAN     39 year old female presenting to the emergency department for treatment and evaluation of headache and elevation in her blood pressure. See HPI for further details. Plan will be to review studies obtained while awaiting ER room assignment and treat as indicated.  DIFFERENTIAL  DIAGNOSIS  Headache secondary to hypertension, tension headache, intracranial abnormality.  ED COURSE  CT of the head is negative for acute findings per radiology. CMP and BMP are unremarkable. Labs are unremarkable.  Patient will be treated with Fioricet for the headache and started on hydrochlorothiazide. She will be encouraged to follow-up with her primary care provider as well. She is to monitor blood pressure closely and keep a log. If she develops chest pain, shortness of breath, dizziness, vomiting, increase in headache or increasing blood pressure despite medications she is either to see primary care right away or return to the emergency department.    ___________________________________________   FINAL CLINICAL IMPRESSION(S) / ED DIAGNOSES  Final diagnoses:  Acute intractable headache, unspecified headache type  Hypertension, unspecified type     ED Discharge Orders         Ordered    hydrochlorothiazide (HYDRODIURIL) 25 MG tablet  Daily        11/17/19 1901           Theadora Noyes was evaluated in Emergency Department on 11/17/2019 for the symptoms described in the history of present illness. She was evaluated in the context of the global COVID-19 pandemic, which necessitated consideration that the patient might be at risk for infection with the SARS-CoV-2 virus that causes COVID-19. Institutional protocols and algorithms that pertain to the evaluation of patients at risk for COVID-19 are in a state of rapid change based on information released by regulatory bodies including the CDC and federal and state organizations. These policies and algorithms were followed during the patient's care in the ED.   Note:  This document was prepared using Dragon voice recognition software and may include unintentional dictation errors.   Victorino Dike, FNP 11/17/19 2013    Duffy Bruce, MD 11/22/19 0700

## 2019-12-01 ENCOUNTER — Emergency Department
Admission: EM | Admit: 2019-12-01 | Discharge: 2019-12-01 | Disposition: A | Discharge status: Home / Self Care | Payer: Managed Care, Other (non HMO) | Attending: Emergency Medicine | Admitting: Emergency Medicine

## 2019-12-01 ENCOUNTER — Other Ambulatory Visit: Payer: Self-pay

## 2019-12-01 ENCOUNTER — Emergency Department: Payer: Managed Care, Other (non HMO)

## 2019-12-01 DIAGNOSIS — N132 Hydronephrosis with renal and ureteral calculous obstruction: Secondary | ICD-10-CM | POA: Insufficient documentation

## 2019-12-01 DIAGNOSIS — J45909 Unspecified asthma, uncomplicated: Secondary | ICD-10-CM | POA: Insufficient documentation

## 2019-12-01 DIAGNOSIS — R109 Unspecified abdominal pain: Secondary | ICD-10-CM | POA: Diagnosis present

## 2019-12-01 DIAGNOSIS — N200 Calculus of kidney: Secondary | ICD-10-CM

## 2019-12-01 DIAGNOSIS — K219 Gastro-esophageal reflux disease without esophagitis: Secondary | ICD-10-CM | POA: Insufficient documentation

## 2019-12-01 LAB — COMPREHENSIVE METABOLIC PANEL
ALT: 19 U/L (ref 0–44)
AST: 21 U/L (ref 15–41)
Albumin: 4 g/dL (ref 3.5–5.0)
Alkaline Phosphatase: 76 U/L (ref 38–126)
Anion gap: 6 (ref 5–15)
BUN: 11 mg/dL (ref 6–20)
CO2: 26 mmol/L (ref 22–32)
Calcium: 9 mg/dL (ref 8.9–10.3)
Chloride: 105 mmol/L (ref 98–111)
Creatinine, Ser: 0.66 mg/dL (ref 0.44–1.00)
GFR, Estimated: >60 mL/min (ref >60)
Glucose, Bld: 101 mg/dL — ABNORMAL HIGH (ref 70–99)
Potassium: 4.1 mmol/L (ref 3.5–5.1)
Sodium: 137 mmol/L (ref 135–145)
Total Bilirubin: 0.6 mg/dL (ref 0.3–1.2)
Total Protein: 7.4 g/dL (ref 6.5–8.1)

## 2019-12-01 LAB — CBC
HCT: 35 % — ABNORMAL LOW (ref 36.0–46.0)
Hemoglobin: 11.9 g/dL — ABNORMAL LOW (ref 12.0–15.0)
MCH: 29.8 pg (ref 26.0–34.0)
MCHC: 34 g/dL (ref 30.0–36.0)
MCV: 87.5 fL (ref 80.0–100.0)
Platelets: 298 10*3/uL (ref 150–400)
RBC: 4 MIL/uL (ref 3.87–5.11)
RDW: 11.8 % (ref 11.5–15.5)
WBC: 8.2 10*3/uL (ref 4.0–10.5)
nRBC: 0 % (ref 0.0–0.2)

## 2019-12-01 LAB — URINALYSIS, COMPLETE (UACMP) WITH MICROSCOPIC
Bilirubin Urine: NEGATIVE
Glucose, UA: NEGATIVE mg/dL
Ketones, ur: NEGATIVE mg/dL
Leukocytes,Ua: NEGATIVE
Nitrite: NEGATIVE
Protein, ur: NEGATIVE mg/dL
RBC / HPF: >50 RBC/hpf — ABNORMAL HIGH (ref 0–5)
Specific Gravity, Urine: 1.014 (ref 1.005–1.030)
pH: 6 (ref 5.0–8.0)

## 2019-12-01 LAB — LIPASE, BLOOD: Lipase: 22 U/L (ref 11–51)

## 2019-12-01 MED ORDER — TAMSULOSIN HCL 0.4 MG PO CAPS: 0.4000 mg | ORAL_CAPSULE | Freq: Every day | ORAL | 0 refills | Status: AC

## 2019-12-01 MED ORDER — KETOROLAC TROMETHAMINE 30 MG/ML IJ SOLN
30.0000 mg | Freq: Once | INTRAMUSCULAR | Status: AC
  Administered 2019-12-01: 30 mg via INTRAMUSCULAR
  Filled 2019-12-01: qty 1

## 2019-12-01 MED ORDER — OXYCODONE-ACETAMINOPHEN 5-325 MG PO TABS: 1.0000 | ORAL_TABLET | Freq: Four times a day (QID) | ORAL | 0 refills | Status: AC | PRN

## 2019-12-01 MED ORDER — OXYCODONE-ACETAMINOPHEN 5-325 MG PO TABS
1.0000 | ORAL_TABLET | Freq: Once | ORAL | Status: AC
  Administered 2019-12-01: 1 via ORAL
  Filled 2019-12-01: qty 1

## 2019-12-01 NOTE — ED Triage Notes (Signed)
Pt ambulatory to triage c/o right sided flank pain that is traveling around into abd starting around 2am. Pt reports difficulty starting urine stream this morning. Denies any blood in urine. Urinated around 1130 this morning. NAD noted at this time.

## 2019-12-01 NOTE — ED Provider Notes (Signed)
[facility] Emergency Department Provider Note  ____________________________________________  Time seen: Approximately 3:48 PM  I have reviewed the triage vital signs and the nursing notes.   HISTORY  Chief Complaint Flank Pain and Abdominal Pain    HPI Jaclyn Wagner is a 39 y.o. female who presents the emergency department complaining of right flank pain. Patient states that this pain woke her up this morning around 2:00. Patient has had no nausea, vomiting, diarrhea or constipation. No dysuria or hematuria. No vaginal complaints. Pain is localized to the right flank. No radiation. Patient has a remote history of kidney stone. Patient denies any other complaints at this time. No medication for this complaint prior to arrival.         Past Medical History:  Diagnosis Date  . Anemia   . Arthritis    R HAND  . Asthma    AS A CHILD  . Fibroids   . GERD (gastroesophageal reflux disease)     There are no problems to display for this patient.   Past Surgical History:  Procedure Laterality Date  . TUBAL LIGATION    . UTERINE ARTERY EMBOLIZATION  04/20/15   at [facility]    Prior to Admission medications   Medication Sig Start Date End Date Taking? Authorizing Provider  glycopyrrolate (ROBINUL) 2 MG tablet Take 1 tab twice daily for abd bloating/pain,cramping. 04/20/17   Esterwood, Amy S, PA-C  hydrochlorothiazide (HYDRODIURIL) 25 MG tablet Take 1 tablet (25 mg total) by mouth daily. 11/17/19 12/17/19  Triplett, Johnette Abraham B, FNP  HYDROcodone-acetaminophen (NORCO) 5-325 MG tablet Take 1 tablet by mouth every 6 (six) hours as needed for moderate pain. 04/25/15   Paulette Blanch, MD  ibuprofen (ADVIL,MOTRIN) 800 MG tablet Take 1 tablet (800 mg total) by mouth every 8 (eight) hours as needed for moderate pain. Patient not taking: Reported on 04/20/2017 04/25/15   Paulette Blanch, MD  ibuprofen (ADVIL,MOTRIN) 800 MG tablet Take 800 mg by mouth every 8 (eight)  hours as needed for mild pain or moderate pain.    [provider]  ondansetron (ZOFRAN ODT) 4 MG disintegrating tablet Take 1 tablet (4 mg total) by mouth every 8 (eight) hours as needed for nausea or vomiting. Patient not taking: Reported on 04/20/2017 04/25/15   Paulette Blanch, MD  ondansetron (ZOFRAN ODT) 4 MG disintegrating tablet Take 1 tablet (4 mg total) by mouth every 8 (eight) hours as needed for nausea or vomiting. Patient not taking: Reported on 04/20/2017 11/24/16   Earleen Newport, MD  oxyCODONE (OXY IR/ROXICODONE) 5 MG immediate release tablet Take 5-10 mg by mouth See admin instructions. Take 1 to 2 tablets by mouth every 4 to 6 hours as needed for severe pain (level 8-10). 04/21/15   [provider]  oxyCODONE-acetaminophen (PERCOCET/ROXICET) 5-325 MG tablet Take 1 tablet by mouth every 6 (six) hours as needed for severe pain. 12/01/19   Jessee Newnam, Charline Bills, PA-C  pantoprazole (PROTONIX) 40 MG tablet Take 1 tablet (40 mg total) by mouth daily. 07/18/14   Milus Banister, MD  pantoprazole (PROTONIX) 40 MG tablet Take 1 tablet (40 mg total) by mouth daily. 11/24/16 11/24/17  Earleen Newport, MD  promethazine (PHENERGAN) 12.5 MG tablet Take 12.5-25 mg by mouth See admin instructions. Take 1 to 2 tablets by mouth every 4 to 6 hours as needed for nausea and vomiting. 04/21/15   [provider]  sucralfate (CARAFATE) 1 g tablet Take 1 tablet (1  g total) by mouth 4 (four) times daily. 11/24/16 11/24/17  Earleen Newport, MD  tamsulosin (FLOMAX) 0.4 MG CAPS capsule Take 1 capsule (0.4 mg total) by mouth daily. 12/01/19   Cooper Stamp, Charline Bills, PA-C    Allergies Penicillins  Family History  Problem Relation Age of Onset  . Ovarian cancer Mother   . Ovarian cancer Maternal Grandmother   . Colon polyps Maternal Grandmother   . Esophageal cancer Paternal Grandmother   . Colon polyps Maternal Aunt        x3  . Colon polyps Sister   . Colon cancer Neg Hx    . Diabetes Neg Hx   . Gallbladder disease Neg Hx   . Heart disease Neg Hx     Social History Social History   Tobacco Use  . Smoking status: Never Smoker  . Smokeless tobacco: Never Used  Vaping Use  . Vaping Use: Never used  Substance Use Topics  . Alcohol use: Yes    Alcohol/week: 0.0 standard drinks    Comment: Occassionally  . Drug use: No     Review of Systems  Constitutional: No fever/chills Eyes: No visual changes. No discharge ENT: No upper respiratory complaints. Cardiovascular: no chest pain. Respiratory: no cough. No SOB. Gastrointestinal: No abdominal pain.  No nausea, no vomiting.  No diarrhea.  No constipation. Genitourinary: Negative for dysuria. No hematuria. Positive for right flank pain Musculoskeletal: Negative for musculoskeletal pain. Skin: Negative for rash, abrasions, lacerations, ecchymosis. Neurological: Negative for headaches, focal weakness or numbness. 10-point ROS otherwise negative.  ____________________________________________   PHYSICAL EXAM:  VITAL SIGNS: ED Triage Vitals  Enc Vitals Group     BP 12/01/19 1235 (!) 157/93     Pulse Rate 12/01/19 1235 84     Resp 12/01/19 1235 18     Temp 12/01/19 1235 98.3 F (36.8 C)     Temp Source 12/01/19 1235 Oral     SpO2 12/01/19 1235 99 %     Weight 12/01/19 1236 188 lb (85.3 kg)     Height 12/01/19 1236 5' 6.25" (1.683 m)     Head Circumference --      Peak Flow --      Pain Score 12/01/19 1236 8     Pain Loc --      Pain Edu? --      Excl. in [place]? --      Constitutional: Alert and oriented. Well appearing and in no acute distress. Eyes: Conjunctivae are normal. PERRL. EOMI. Head: Atraumatic. ENT:      Ears:       Nose: No congestion/rhinnorhea.      Mouth/Throat: Mucous membranes are moist.  Neck: No stridor.    Cardiovascular: Normal rate, regular rhythm. Normal S1 and S2.  Good peripheral circulation. Respiratory: Normal respiratory effort without tachypnea or  retractions. Lungs CTAB. Good air entry to the bases with no decreased or absent breath sounds. Gastrointestinal: No external abdominal wall findings. Bowel sounds 4 quadrants. Soft and nontender to palpation. No guarding or rigidity. No palpable masses. No distention. Right-sided CVA tenderness. Musculoskeletal: Full range of motion to all extremities. No gross deformities appreciated. Neurologic:  Normal speech and language. No gross focal neurologic deficits are appreciated.  Skin:  Skin is warm, dry and intact. No rash noted. Psychiatric: Mood and affect are normal. Speech and behavior are normal. Patient exhibits appropriate insight and judgement.   ____________________________________________   LABS (all labs ordered are listed, but only abnormal results are displayed)  Labs Reviewed  COMPREHENSIVE METABOLIC PANEL - Abnormal; Notable for the following components:      Result Value   Glucose, Bld 101 (*)    All other components within normal limits  CBC - Abnormal; Notable for the following components:   Hemoglobin 11.9 (*)    HCT 35.0 (*)    All other components within normal limits  URINALYSIS, COMPLETE (UACMP) WITH MICROSCOPIC - Abnormal; Notable for the following components:   Color, Urine YELLOW (*)    APPearance HAZY (*)    Hgb urine dipstick LARGE (*)    RBC / HPF >50 (*)    Bacteria, UA RARE (*)    All other components within normal limits  LIPASE, BLOOD   ____________________________________________  EKG   ____________________________________________  RADIOLOGY I personally viewed and evaluated these images as part of my medical decision making, as well as reviewing the written report by the radiologist.  CT Renal Stone Study  Result Date: 12/01/2019 CLINICAL DATA:  Right flank pain today. EXAM: CT ABDOMEN AND PELVIS WITHOUT CONTRAST TECHNIQUE: Multidetector CT imaging of the abdomen and pelvis was performed following the standard protocol without IV  contrast. COMPARISON:  April 25, 2015 FINDINGS: Lower chest: No acute abnormality. Hepatobiliary: No focal liver abnormality is seen. No gallstones, gallbladder wall thickening, or biliary dilatation. Pancreas: Unremarkable. No pancreatic ductal dilatation or surrounding inflammatory changes. Spleen: Normal in size without focal abnormality. Adrenals/Urinary Tract: Right perinephric stranding is identified. Moderate right hydronephrosis is noted due to obstruction by 3 mm stone in the distal right ureter. Nonobstructing stones are identified in both kidneys. There is no left hydronephrosis. The bilateral adrenal glands are normal. Bladder is normal. Stomach/Bowel: Stomach is within normal limits. The appendix is not seen but no inflammation is noted around the cecum. No evidence of bowel wall thickening, distention, or inflammatory changes. Vascular/Lymphatic: No significant vascular findings are present. No enlarged abdominal or pelvic lymph nodes. Reproductive: Uterus and bilateral adnexa are unremarkable. Other: Small amount free fluid is identified in the pelvis, physiologic. Musculoskeletal: No acute abnormality. IMPRESSION: Moderate right hydronephrosis due to obstruction by 3 mm stone in the distal right ureter. Electronically Signed   By: Abelardo Diesel M.D.   On: 12/01/2019 16:23    ____________________________________________    PROCEDURES  Procedure(s) performed:    Procedures    Medications  ketorolac (TORADOL) 30 MG/ML injection 30 mg (30 mg Intramuscular Given 12/01/19 1751)  oxyCODONE-acetaminophen (PERCOCET/ROXICET) 5-325 MG per tablet 1 tablet (1 tablet Oral Given 12/01/19 1752)     ____________________________________________   INITIAL IMPRESSION / ASSESSMENT AND PLAN / ED COURSE  Pertinent labs & imaging results that were available during my care of the patient were reviewed by me and considered in my medical decision making (see chart for details).  Review of the [place]  CSRS was performed in accordance of the [place] prior to dispensing any controlled drugs.           Patient's diagnosis is consistent with nephrolithiasis.  Patient presented to emergency department sudden onset of right flank pain.  Suspicion was for nephrolithiasis but differential included pyelonephritis, UTI, ovarian cyst.  Imaging reveals 3 mm stone in the distal ureter with mild hydronephrosis.  Patient will be prescribed pain medication, Flomax.  If symptoms resolve no follow-up is necessary.  Patient continues to be symptomatic, follow-up with urology for further assessment and management to see if patient will require lithotripsy.  Patient stable for discharge at this time.  She understands follow-up plan.Marland Kitchen  Return precautions are discussed with the patient.  Patient is given ED precautions to return to the ED for any worsening or new symptoms.     ____________________________________________  FINAL CLINICAL IMPRESSION(S) / ED DIAGNOSES  Final diagnoses:  Nephrolithiasis      NEW MEDICATIONS STARTED DURING THIS VISIT:  ED Discharge Orders         Ordered    oxyCODONE-acetaminophen (PERCOCET/ROXICET) 5-325 MG tablet  Every 6 hours PRN        12/01/19 1713    tamsulosin (FLOMAX) 0.4 MG CAPS capsule  Daily        12/01/19 1713              This chart was dictated using voice recognition software/Dragon. Despite best efforts to proofread, errors can occur which can change the meaning. Any change was purely unintentional.    Darletta Moll, PA-C 12/01/19 2119    Delman Kitten, MD 12/03/19 1346

## 2021-07-30 ENCOUNTER — Emergency Department
Admission: EM | Admit: 2021-07-30 | Discharge: 2021-07-30 | Discharge status: Against Medical Advice | Payer: Managed Care, Other (non HMO) | Attending: Emergency Medicine | Admitting: Emergency Medicine

## 2021-07-30 ENCOUNTER — Other Ambulatory Visit: Payer: Self-pay

## 2021-07-30 DIAGNOSIS — R1032 Left lower quadrant pain: Secondary | ICD-10-CM | POA: Diagnosis present

## 2021-07-30 DIAGNOSIS — Z5321 Procedure and treatment not carried out due to patient leaving prior to being seen by health care provider: Secondary | ICD-10-CM | POA: Diagnosis not present

## 2021-07-30 LAB — URINALYSIS, ROUTINE W REFLEX MICROSCOPIC
Bilirubin Urine: NEGATIVE
Glucose, UA: NEGATIVE mg/dL
Ketones, ur: NEGATIVE mg/dL
Leukocytes,Ua: NEGATIVE
Nitrite: NEGATIVE
Protein, ur: 30 mg/dL — AB
Specific Gravity, Urine: 1.031 — ABNORMAL HIGH (ref 1.005–1.030)
pH: 5 (ref 5.0–8.0)

## 2021-07-30 LAB — CBC
HCT: 35.7 % — ABNORMAL LOW (ref 36.0–46.0)
Hemoglobin: 11.3 g/dL — ABNORMAL LOW (ref 12.0–15.0)
MCH: 28.7 pg (ref 26.0–34.0)
MCHC: 31.7 g/dL (ref 30.0–36.0)
MCV: 90.6 fL (ref 80.0–100.0)
Platelets: 329 10*3/uL (ref 150–400)
RBC: 3.94 MIL/uL (ref 3.87–5.11)
RDW: 12.1 % (ref 11.5–15.5)
WBC: 7.1 10*3/uL (ref 4.0–10.5)
nRBC: 0 % (ref 0.0–0.2)

## 2021-07-30 LAB — COMPREHENSIVE METABOLIC PANEL
ALT: 22 U/L (ref 0–44)
AST: 26 U/L (ref 15–41)
Albumin: 3.9 g/dL (ref 3.5–5.0)
Alkaline Phosphatase: 84 U/L (ref 38–126)
Anion gap: 3 — ABNORMAL LOW (ref 5–15)
BUN: 15 mg/dL (ref 6–20)
CO2: 27 mmol/L (ref 22–32)
Calcium: 8.6 mg/dL — ABNORMAL LOW (ref 8.9–10.3)
Chloride: 108 mmol/L (ref 98–111)
Creatinine, Ser: 0.78 mg/dL (ref 0.44–1.00)
GFR, Estimated: >60 mL/min (ref >60)
Glucose, Bld: 103 mg/dL — ABNORMAL HIGH (ref 70–99)
Potassium: 3.9 mmol/L (ref 3.5–5.1)
Sodium: 138 mmol/L (ref 135–145)
Total Bilirubin: 0.4 mg/dL (ref 0.3–1.2)
Total Protein: 7.2 g/dL (ref 6.5–8.1)

## 2021-07-30 LAB — LIPASE, BLOOD: Lipase: 25 U/L (ref 11–51)

## 2021-07-30 LAB — POC URINE PREG, ED: Preg Test, Ur: NEGATIVE

## 2021-07-30 NOTE — ED Triage Notes (Signed)
Ambulatory to triage with c/o pain to LLQ, onset yesterday. Pain is sharp, Throbbing, intermittent. Denies N/V/D. BM normal. Pt sitting in chair comfortably with NAD noted.

## 2021-07-30 NOTE — ED Notes (Signed)
No answer when called several times from lobby 

## 2022-03-28 ENCOUNTER — Encounter: Payer: Self-pay | Admitting: Emergency Medicine

## 2022-03-28 ENCOUNTER — Emergency Department
Admission: EM | Admit: 2022-03-28 | Discharge: 2022-03-29 | Disposition: A | Discharge status: Home / Self Care | Payer: BC Managed Care – PPO | Attending: Emergency Medicine | Admitting: Emergency Medicine

## 2022-03-28 ENCOUNTER — Emergency Department: Payer: BC Managed Care – PPO

## 2022-03-28 DIAGNOSIS — E876 Hypokalemia: Secondary | ICD-10-CM | POA: Diagnosis not present

## 2022-03-28 DIAGNOSIS — Z79899 Other long term (current) drug therapy: Secondary | ICD-10-CM | POA: Insufficient documentation

## 2022-03-28 DIAGNOSIS — R0602 Shortness of breath: Secondary | ICD-10-CM | POA: Diagnosis present

## 2022-03-28 DIAGNOSIS — I10 Essential (primary) hypertension: Secondary | ICD-10-CM | POA: Insufficient documentation

## 2022-03-28 DIAGNOSIS — D649 Anemia, unspecified: Secondary | ICD-10-CM | POA: Insufficient documentation

## 2022-03-28 DIAGNOSIS — R058 Other specified cough: Secondary | ICD-10-CM

## 2022-03-28 DIAGNOSIS — B349 Viral infection, unspecified: Secondary | ICD-10-CM | POA: Diagnosis not present

## 2022-03-28 DIAGNOSIS — Z1152 Encounter for screening for COVID-19: Secondary | ICD-10-CM | POA: Insufficient documentation

## 2022-03-28 LAB — CBC
HCT: 37.2 % (ref 36.0–46.0)
Hemoglobin: 11.9 g/dL — ABNORMAL LOW (ref 12.0–15.0)
MCH: 28.7 pg (ref 26.0–34.0)
MCHC: 32 g/dL (ref 30.0–36.0)
MCV: 89.6 fL (ref 80.0–100.0)
Platelets: 341 10*3/uL (ref 150–400)
RBC: 4.15 MIL/uL (ref 3.87–5.11)
RDW: 12.4 % (ref 11.5–15.5)
WBC: 8.6 10*3/uL (ref 4.0–10.5)
nRBC: 0 % (ref 0.0–0.2)

## 2022-03-28 LAB — BASIC METABOLIC PANEL
Anion gap: 8 (ref 5–15)
BUN: 11 mg/dL (ref 6–20)
CO2: 24 mmol/L (ref 22–32)
Calcium: 8.5 mg/dL — ABNORMAL LOW (ref 8.9–10.3)
Chloride: 105 mmol/L (ref 98–111)
Creatinine, Ser: 0.65 mg/dL (ref 0.44–1.00)
GFR, Estimated: >60 mL/min (ref >60)
Glucose, Bld: 99 mg/dL (ref 70–99)
Potassium: 3.4 mmol/L — ABNORMAL LOW (ref 3.5–5.1)
Sodium: 137 mmol/L (ref 135–145)

## 2022-03-28 LAB — RESP PANEL BY RT-PCR (RSV, FLU A&B, COVID)  RVPGX2
Influenza A by PCR: NEGATIVE
Influenza B by PCR: NEGATIVE
Resp Syncytial Virus by PCR: NEGATIVE
SARS Coronavirus 2 by RT PCR: NEGATIVE

## 2022-03-28 LAB — TROPONIN I (HIGH SENSITIVITY): Troponin I (High Sensitivity): 3 ng/L (ref <18)

## 2022-03-28 NOTE — ED Triage Notes (Signed)
Pt presents via POV with complaints of SOB with associated productive cough for the last two weeks. Pt states she's had an increasingly harder time breathing when laying flat due to the excessive mucus. Denies fevers, N/V/D.

## 2022-03-29 MED ORDER — PREDNISONE 20 MG PO TABS
40.0000 mg | ORAL_TABLET | Freq: Once | ORAL | Status: AC
  Administered 2022-03-29: 40 mg via ORAL
  Filled 2022-03-29: qty 2

## 2022-03-29 MED ORDER — BENZONATATE 100 MG PO CAPS: 100.0000 mg | ORAL_CAPSULE | Freq: Four times a day (QID) | ORAL | 0 refills | Status: AC | PRN

## 2022-03-29 MED ORDER — PREDNISONE 20 MG PO TABS: 40.0000 mg | ORAL_TABLET | Freq: Every day | ORAL | 0 refills | Status: AC

## 2022-03-29 NOTE — ED Provider Notes (Signed)
[facility] Provider Note    Event Date/Time   First MD Initiated Contact with Patient 03/29/22 [phone]     (approximate)   History   Shortness of Breath   HPI  Jaclyn Wagner is a 42 y.o. female with a history of hypertension  Patient reports a little less than a month ago she had a cough cold fevers was seen in urgent care in [facility] (no electronic medical record for that visit available, she reports it was not a [facility] healthcare urgent care) but was told that she was eventually diagnosed with "parainfluenza virus" and received a course of doxycycline.  Since then she has had a lingering cough especially at night.  No runny nose, no acid reflux no chest pain.  She reports that at night frequently and she will cough, she will cough a little bit through the day for the most part cough will keep her up at night.  There is no chest pain no nausea no vomiting no ongoing fevers.  No long trips or travels.  No recent surgeries.  No history of blood clots.  No leg swelling.  She does report she is treated for high blood pressure, she takes only hydrochlorothiazide as she reports she has been variation in her blood pressure and sometimes it will go too low in the past as well avoids taking additional blood pressure medicine     Physical Exam   Triage Vital Signs: ED Triage Vitals  Enc Vitals Group     BP 03/28/22 2305 (!) 160/103     Pulse Rate 03/28/22 2305 94     Resp 03/28/22 2305 18     Temp 03/28/22 2305 98.6 F (37 C)     Temp Source 03/28/22 2305 Oral     SpO2 03/28/22 2305 100 %     Weight 03/28/22 2255 200 lb (90.7 kg)     Height 03/28/22 2255 '5\' 6"'$  (1.676 m)     Head Circumference --      Peak Flow --      Pain Score 03/28/22 2255 4     Pain Loc --      Pain Edu? --      Excl. in [place]? --     Most recent vital signs: Vitals:   03/28/22 2305  BP: (!) 160/103  Pulse: 94  Resp: 18  Temp: 98.6 F (37 C)  SpO2: 100%     General: Awake,  no distress.  Very pleasant.  Normal work of breathing normal oxygen saturation fully alert and oriented nontoxic well-appearing.  No active coughing CV:  Good peripheral perfusion.  Normal heart tones and rate no murmurs Resp:  Normal effort.  Lung sounds are clear bilaterally.  Normal work of breathing.  No acute cough noted.  No pleuritic pain or discomfort Abd:  No distention.  Other:  No lower extremity edema or swelling   ED Results / Procedures / Treatments   Labs (all labs ordered are listed, but only abnormal results are displayed) Labs Reviewed  BASIC METABOLIC PANEL - Abnormal; Notable for the following components:      Result Value   Potassium 3.4 (*)    Calcium 8.5 (*)    All other components within normal limits  CBC - Abnormal; Notable for the following components:   Hemoglobin 11.9 (*)    All other components within normal limits  RESP PANEL BY RT-PCR (RSV, FLU A&B, COVID)  RVPGX2  TROPONIN I (HIGH SENSITIVITY)  TROPONIN I (HIGH SENSITIVITY)   Labs reviewed very mild anemia very mild hypokalemia  EKG  EKG interpreted by me at 115 heart rate 90 QRS 85 QTc 460 Normal sinus rhythm no evidence of acute ischemia   RADIOLOGY  Chest x-ray interpreted by me as negative for acute      PROCEDURES:  Critical Care performed: No  Procedures   MEDICATIONS ORDERED IN ED: Medications  predniSONE (DELTASONE) tablet 40 mg (has no administration in time range)     IMPRESSION / MDM / ASSESSMENT AND PLAN / ED COURSE  I reviewed the triage vital signs and the nursing notes.                              Differential diagnosis includes, but is not limited to, post viral cough, pneumonia, bronchitis, no clear signs or symptoms suggest ACS, no associated chest pain, no pneumothorax.  No ongoing clear infectious symptoms such as fever she does report cough mostly at night that is bothersome and it started in the setting of parainfluenza diagnosis.  She self-reports  parainfluenza and was treated with doxycycline I see no evidence of pneumonia.  She does not have any signs or symptoms that are suggestive of volume overload.  She does have hypertension, but also reports she has not taken her hydrochlorothiazide today and also historically reports she has variation in her blood pressure and sometimes will run and typically does run low lower.  Do not see evidence of acute CHF no signs of pulmonary edema.      Pulmonary Embolism Rule-out Criteria (PERC rule)                        If YES to ANY of the following, the PERC rule is not satisfied and cannot be used to rule out PE in this patient (consider d-dimer or imaging depending on pre-test probability).                      If NO to ALL of the following, AND the clinician's pre-test probability is <15%, the [facility] rule is satisfied and there is no need for further workup (including no need to obtain a d-dimer) as the post-test probability of pulmonary embolism is <2%.                      Mnemonic is HAD CLOTS   H - hormone use (exogenous estrogen)      No. A - age > 50                                                 No. D - DVT/PE history                                      No.   C - coughing blood (hemoptysis)                 No. L - leg swelling, unilateral                             No. O - O2 Sat on Room  Air < 95%                  No. T - tachycardia (HR ? 100)                         No. S - surgery or trauma, recent                      No.   Based on my evaluation of the patient, including application of this decision instrument, further testing to evaluate for pulmonary embolism is not indicated at this time.   Patient's presentation is most consistent with acute complicated illness / injury requiring diagnostic workup.   ----------------------------------------- 1:19 AM on 03/29/2022 ----------------------------------------- After seeing and evaluating the patient I feels most likely this  is postviral cough having had parainfluenza virus and ongoing cough was particularly at night.  Workup here quite reassuring plan.  Plan to discharge her will trial a short course of prednisone as anti-inflammatory and prescription for Tessalon.  Patient agreeable with this and return precautions.  Anticipate follow-up with [facility] with her primary care  Return precautions and treatment recommendations and follow-up discussed with the patient who is agreeable with the plan.        FINAL CLINICAL IMPRESSION(S) / ED DIAGNOSES   Final diagnoses:  Post-viral cough syndrome     Rx / DC Orders   ED Discharge Orders          Ordered    predniSONE (DELTASONE) 20 MG tablet  Daily with breakfast        03/29/22 0112    benzonatate (TESSALON PERLES) 100 MG capsule  Every 6 hours PRN        03/29/22 0112             Note:  This document was prepared using Dragon voice recognition software and may include unintentional dictation errors.   Delman Kitten, MD 03/29/22 [phone]
